# Patient Record
Sex: Female | Born: 1985 | Race: White | Hispanic: No | Smoking: Current every day smoker
Health system: Southern US, Community
[De-identification: ages and names within clinical notes are randomized; demographics above are authoritative.]

## PROBLEM LIST (undated history)

## (undated) DIAGNOSIS — R112 Nausea with vomiting, unspecified: Secondary | ICD-10-CM

## (undated) DIAGNOSIS — Z9889 Other specified postprocedural states: Secondary | ICD-10-CM

## (undated) DIAGNOSIS — F99 Mental disorder, not otherwise specified: Secondary | ICD-10-CM

## (undated) DIAGNOSIS — M797 Fibromyalgia: Secondary | ICD-10-CM

## (undated) DIAGNOSIS — R87629 Unspecified abnormal cytological findings in specimens from vagina: Secondary | ICD-10-CM

## (undated) DIAGNOSIS — T1490XA Injury, unspecified, initial encounter: Secondary | ICD-10-CM

## (undated) DIAGNOSIS — B079 Viral wart, unspecified: Secondary | ICD-10-CM

## (undated) DIAGNOSIS — Q828 Other specified congenital malformations of skin: Secondary | ICD-10-CM

## (undated) HISTORY — DX: Other specified congenital malformations of skin: Q82.8

## (undated) HISTORY — DX: Injury, unspecified, initial encounter: T14.90XA

## (undated) HISTORY — DX: Unspecified abnormal cytological findings in specimens from vagina: R87.629

## (undated) HISTORY — PX: ENDOMETRIAL ABLATION: SHX621

## (undated) HISTORY — DX: Viral wart, unspecified: B07.9

## (undated) HISTORY — PX: TUBAL LIGATION: SHX77

## (undated) HISTORY — PX: TONSILLECTOMY: SUR1361

## (undated) HISTORY — DX: Mental disorder, not otherwise specified: F99

---
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
## 2005-05-19 ENCOUNTER — Inpatient Hospital Stay (HOSPITAL_COMMUNITY): Admission: AD | Admit: 2005-05-19 | Discharge: 2005-05-19 | Payer: Self-pay | Admitting: Obstetrics and Gynecology

## 2005-06-05 ENCOUNTER — Inpatient Hospital Stay (HOSPITAL_COMMUNITY): Admission: AD | Admit: 2005-06-05 | Discharge: 2005-06-05 | Payer: Self-pay | Admitting: Obstetrics

## 2005-06-13 ENCOUNTER — Encounter (INDEPENDENT_AMBULATORY_CARE_PROVIDER_SITE_OTHER): Payer: Self-pay | Admitting: Specialist

## 2005-06-13 ENCOUNTER — Inpatient Hospital Stay (HOSPITAL_COMMUNITY): Admission: AD | Admit: 2005-06-13 | Discharge: 2005-06-15 | Payer: Self-pay | Admitting: Obstetrics and Gynecology

## 2005-07-26 ENCOUNTER — Other Ambulatory Visit: Admission: RE | Admit: 2005-07-26 | Discharge: 2005-07-26 | Payer: Self-pay | Admitting: Obstetrics and Gynecology

## 2005-08-09 ENCOUNTER — Ambulatory Visit: Payer: Self-pay | Admitting: Gastroenterology

## 2005-08-24 ENCOUNTER — Ambulatory Visit: Payer: Self-pay | Admitting: Gastroenterology

## 2005-08-31 ENCOUNTER — Encounter: Payer: Self-pay | Admitting: Gastroenterology

## 2005-08-31 ENCOUNTER — Ambulatory Visit: Payer: Self-pay | Admitting: Gastroenterology

## 2005-09-05 ENCOUNTER — Ambulatory Visit: Payer: Self-pay | Admitting: Gastroenterology

## 2006-09-18 ENCOUNTER — Inpatient Hospital Stay (HOSPITAL_COMMUNITY): Admission: AD | Admit: 2006-09-18 | Discharge: 2006-09-18 | Payer: Self-pay | Admitting: Obstetrics and Gynecology

## 2006-10-22 ENCOUNTER — Inpatient Hospital Stay (HOSPITAL_COMMUNITY): Admission: AD | Admit: 2006-10-22 | Discharge: 2006-10-22 | Payer: Self-pay | Admitting: Obstetrics and Gynecology

## 2006-11-22 ENCOUNTER — Inpatient Hospital Stay (HOSPITAL_COMMUNITY): Admission: RE | Admit: 2006-11-22 | Discharge: 2006-11-23 | Payer: Self-pay | Admitting: Obstetrics and Gynecology

## 2007-09-22 ENCOUNTER — Ambulatory Visit (HOSPITAL_BASED_OUTPATIENT_CLINIC_OR_DEPARTMENT_OTHER): Admission: RE | Admit: 2007-09-22 | Discharge: 2007-09-22 | Payer: Self-pay | Admitting: Rheumatology

## 2007-10-13 ENCOUNTER — Ambulatory Visit: Payer: Self-pay | Admitting: Internal Medicine

## 2007-10-23 ENCOUNTER — Encounter: Admission: RE | Admit: 2007-10-23 | Discharge: 2007-10-23 | Payer: Self-pay | Admitting: Rheumatology

## 2009-09-04 ENCOUNTER — Emergency Department (HOSPITAL_BASED_OUTPATIENT_CLINIC_OR_DEPARTMENT_OTHER)
Admission: EM | Admit: 2009-09-04 | Discharge: 2009-09-05 | Payer: Self-pay | Source: Home / Self Care | Admitting: Emergency Medicine

## 2009-12-28 ENCOUNTER — Emergency Department (HOSPITAL_BASED_OUTPATIENT_CLINIC_OR_DEPARTMENT_OTHER)
Admission: EM | Admit: 2009-12-28 | Discharge: 2009-12-28 | Payer: Self-pay | Source: Home / Self Care | Admitting: Emergency Medicine

## 2010-01-21 ENCOUNTER — Ambulatory Visit (HOSPITAL_COMMUNITY)
Admission: RE | Admit: 2010-01-21 | Discharge: 2010-01-21 | Payer: Self-pay | Source: Home / Self Care | Attending: Obstetrics & Gynecology | Admitting: Obstetrics & Gynecology

## 2010-03-08 ENCOUNTER — Encounter: Payer: Self-pay | Admitting: Rheumatology

## 2010-04-26 LAB — PREGNANCY, URINE: Preg Test, Ur: NEGATIVE

## 2010-05-01 LAB — URINALYSIS, ROUTINE W REFLEX MICROSCOPIC
Bilirubin Urine: NEGATIVE
Glucose, UA: NEGATIVE mg/dL
Hgb urine dipstick: NEGATIVE
Ketones, ur: 15 mg/dL — AB
Nitrite: NEGATIVE
Protein, ur: NEGATIVE mg/dL
Specific Gravity, Urine: 1.016 (ref 1.005–1.030)
Urobilinogen, UA: 0.2 mg/dL (ref 0.0–1.0)
pH: 7 (ref 5.0–8.0)

## 2010-05-01 LAB — URINE MICROSCOPIC-ADD ON

## 2010-05-01 LAB — URINE CULTURE: Colony Count: 45000

## 2010-05-01 LAB — PREGNANCY, URINE: Preg Test, Ur: NEGATIVE

## 2010-06-29 NOTE — Consult Note (Signed)
[name] [name], SHANKER NO.:  [id number]   MEDICAL RECORD NO.:  [id number]          PATIENT TYPE:  MAT   LOCATION:  [facility]C                          FACILITY:  [facility]   PHYSICIAN:  Lenoard Aden, M.D.DATE OF BIRTH:  21-Aug-1985   DATE OF CONSULTATION:  DATE OF DISCHARGE:                                 CONSULTATION   CHIEF COMPLAINT:  Headache and shortness of breath.   She is a 25 year old white female, G2, P1, at 59 weeks' gestation, who  presents with onset this afternoon of shortness of breath and unilateral  headache which initially was severe with sharp mid sternal pain, now  with marked resolution and has become a dull pain with slight resolution  of her headache.  She did not take her migraine medication at that time.  She denies at the time any chest pain, shortness of breath.  She denies  any productive cough.   MEDICAL HISTORY:  1. Migraine headaches.  2. Abnormal Pap smear.   She is a nonsmoker, nondrinker.  She denies domestic or physical  violence.   SURGICAL HISTORY:  1. Tonsillectomy.  2. Wisdom tooth extraction.   FAMILY HISTORY:  Remarkable for hypercholesterolemia, anemia, breast  cancer, colon cancer, schizophrenia, alcohol abuse, and congenital  anomalies.   MEDICATIONS:  Prenatal vitamins, Zantac, and Fioricet as needed.   PHYSICAL EXAMINATION:  GENERAL:  She is well-developed, well-nourished,  white female in no acute distress.  VITAL SIGNS:  Blood pressure 112/60, oxygen saturation 95, pulse of 88,  respirations 18.  HEENT:  Normal.  LUNGS:  Clear.  HEART:  Regular rhythm.  ABDOMEN:  Soft, gravid nontender.  No CVA tenderness is noted.  EXTREMITIES:  Reveal no cords.  NEUROLOGIC:  Nonfocal.  SKIN:  Intact.  PELVIC:  Deferred.   NST is reactive.  No contractions noted.   IMPRESSION:  1. Patient with a 29-week intrauterine pregnancy.  2. Previous history of headache, shortness of breath.  No evidence of      pre-eclampsia,  probable migraine prodrome now having taken two      Fioricet.  3. Chest pain, questionable atypical, questionable reflux, now having      resolved.  Normal cardiovascular exam.   PLAN:  Discharge home, Fioricet 2 p.o. now, follow up as needed, chest x-  ray is no improvement.      Lenoard Aden, M.D.  Electronically Signed     RJT/MEDQ  D:  09/18/2006  T:  09/18/2006  Job:  [id number]

## 2010-06-29 NOTE — Procedures (Signed)
NAME:  [name] [name], RUEHL NO.:  [id number]   MEDICAL RECORD NO.:  [id number]          PATIENT TYPE:  OUT   LOCATION:  SLEEP CENTER                 FACILITY:  [facility]   PHYSICIAN:  Clinton D. Maple Hudson, MD, FCCP, FACPDATE OF BIRTH:  Jan 23, 1986   DATE OF STUDY:                            NOCTURNAL POLYSOMNOGRAM   REFERRING PHYSICIAN:  Kathryne Hitch, MD   INDICATION FOR STUDY:  Insomnia with sleep apnea.   EPWORTH SLEEPINESS SCORE:  9/24, BMI 46, weight 285 pounds, height 66  inches, neck 15 inches.   HOME MEDICATION:  Charted and reviewed.   SLEEP ARCHITECTURE:  Total sleep time 317.5 minutes with sleep  efficiency 79.4%.  Stage I was 3.1%, stage II 56.2%, stage III 17.8%,  REM 22.8% of total sleep time.  Sleep latency 60 minutes, REM latency  135 minutes, awake after sleep onset 7 minutes, arousal index 11.5.  No  bedtime medication taken.   RESPIRATORY DATA:  Apnea/hypopnea index (AHI) 0.  No respiratory events  were scored.   OXYGEN DATA:  Moderate snoring with oxygen desaturation to a nadir of  91%.  Mean oxygen saturation through the study was 95.6% on room air.   CARDIAC DATA:  Normal sinus rhythm.   MOVEMENT/PARASOMNIA:  No significant movement disturbance.  No bathroom  trips.   IMPRESSIONS/RECOMMENDATIONS:  1. Unremarkable sleep architecture for sleep center environment.  She      complained of fibromyalgia pain which she attributed to moving      heavy tanning beds earlier.  2. No significant respiratory disturbance, AHI (apnea/hypopnea index)      0 per hour.  Moderate snoring with oxygen desaturation to a nadir      of 91%.  3. No significant movement disturbance of sleep.  4. Patient woke at 3:30 a.m. indicating that she would not be able to      return to sleep and went home at that time.     Clinton D. Maple Hudson, MD, [facility], FACP  Diplomate, Biomedical engineer of Sleep Medicine  Electronically Signed    CDY/MEDQ  D:  10/13/2007 10:56:29  T:   10/13/2007 11:51:38  Job:  [id number]

## 2010-07-02 NOTE — Op Note (Signed)
NAMEMANDI, MATTIOLI              ACCOUNT NO.:  [id number]   MEDICAL RECORD NO.:  [id number]          PATIENT TYPE:  AMB   LOCATION:  DSC                          FACILITY:  [facility]   PHYSICIAN:  Jefry H. Pollyann Kennedy, MD     DATE OF BIRTH:  10-31-1985   DATE OF PROCEDURE:  02/23/2004  DATE OF DISCHARGE:                                 OPERATIVE REPORT   PREOPERATIVE DIAGNOSIS:  Chronic tonsillitis.   POSTOPERATIVE DIAGNOSIS:  Chronic tonsillitis.   PROCEDURE:  Tonsillectomy.   SURGEON:  Jefry H. Pollyann Kennedy, M.D.   ANESTHESIA:  General endotracheal anesthesia was used.   COMPLICATIONS:  No complications.   BLOOD LOSS:  Minimal.   REFERRING PHYSICIAN:  [facility].   HISTORY:  This is an 25 year old with a history of chronic and recurring  tonsillar pharyngitis.  Risks, benefits, alternatives, and complications of  the procedure were explained to the patient.  She seemed to understand and  agreed to the surgery.   PROCEDURE:  The patient was taken to the operating room and placed on the  operating table in the supine position.  Following the induction of general  endotracheal anesthesia, the patient was prepped and draped in a standard  fashion.  A Crowe-Davis mouth gag was inserted into the oral cavity and used  to retract the tongue and mandible and attached to the Mayo stand.  Inspection of the palate revealed no evidence of submucous cleft or  shortening of the soft palate.  A red rubber catheter was inserted into the  right side of the nose and withdrawn through the mouth and used to retract  the soft palate and uvula.  Indirect examination of the pharynx was  performed, and minimal adenoid tissue was present.  Tonsillectomy was  performed using electrocautery dissection, carefully dissecting the  avascular plane between the capsule and constrictor muscles.  Spot cautery  was used as an aid for completion of hemostasis.  The tonsils were very  large, pitted, and with  some fibrosis at the bases and at their  attachment to the constrictors.  Tonsils were sent together for pathologic  evaluation.  Pharynx was suctioned and blood and secretions irrigated with  saline, and an orogastric tube was used to aspirate the contents of the  stomach.  The patient was then awakened, extubated, and transferred to  recovery in stable condition.      Jefr   JHR/MEDQ  D:  02/23/2004  T:  02/23/2004  Job:  [id number]   cc:   [facility]

## 2010-07-02 NOTE — Discharge Summary (Signed)
NAME:  [name] [name], [name] [name]              ACCOUNT NO.:  [id number]   MEDICAL RECORD NO.:  [id number]          PATIENT TYPE:  INP   LOCATION:  9126                          FACILITY:  [facility]   PHYSICIAN:  James A. Ashley Royalty, M.D.DATE OF BIRTH:  November 09, 1985   DATE OF ADMISSION:  06/13/2005  DATE OF DISCHARGE:  06/15/2005                                 DISCHARGE SUMMARY   DISCHARGE DIAGNOSES:  1.  Intrauterine pregnancy at 41-1/2 weeks' gestation.  2.  A negative blood type.  3.  Migraine headaches.  4.  Term birth living child vertex.   OPERATIONS/SPECIAL PROCEDURES:  OB delivery with repair of right-sided  vaginal sidewall laceration.   CONSULTATIONS:  None.   DISCHARGE MEDICATIONS:  1.  Motrin 600 mg.  2.  Percocet.   HISTORY AND PHYSICAL:  This is a 25 year old primigravida at 41-1/[redacted] weeks  gestation with the aforementioned risk factors.  The patient presented  complaining of contractions.  Initial cervical examination revealed the  cervix to be 2-3 cm dilated, 75% effaced, -2 station, vertex presentation.   HOSPITAL COURSE:  The patient admitted to [facility].  Artificial rupture of membranes was accomplished which revealed thin  meconium.  The patient went on to labor and deliver an 8 pound 15 ounce  female, Apgars 8 at one minute, 9 at five minutes, sent to the newborn  nursery.  Delivery was accomplished by Erven Colla.  The infant was  DeLee'd on the perineum.  The pediatric team was present.  There was no  episiotomy, but there was a right-sided vaginal sidewall laceration which  was repaired by me without difficulty.  The patient's postpartum course was  benign.  She was discharged on the second postpartum day, afebrile and in  satisfactory condition.   DISPOSITION:  The patient is to return to [facility] in 4-6 weeks  for postpartum evaluation.      James A. Ashley Royalty, M.D.  Electronically Signed     JAM/MEDQ  D:  08/03/2005  T:   08/04/2005  Job:  [id number]

## 2010-07-02 NOTE — Procedures (Signed)
[facility]                                 ULTRASOUND STUDY   NAME:[name], [name]                       MRN:          [id number]  DATE:  09/06/2005                              DOB:    PROCEDURE:  Multiplanar abdominal ultrasound imaging was performed in the  upright, supine, right and left lateral decubitus positions.   RESULTS:  Abdominal aorta 1.4 X 1.3 cm.  The IVC is patent.   The pancreas appears normal throughout the head, body and tail without  evidence of ductal dilatation, pancreatic masses, or peripancreatic  inflammation.   Gallbladder is well distended, thin walled, with no pericholecystic fluid or  intraluminal echogenic foci to suggest gallstone disease.   The common bile duct measures 3 mm in maximal diameter without evidence of  intraluminal foci.   The liver appears normal without evidence of parenchymal lesion, ductal  dilatation or vascular abnormality.   Kidneys are normal in appearance.  Right 11.4 cm.  Left 11.9 cm.   Spleen is normal in size, measuring 14 x 6.4 cm without parenchymal lesion.   ASSESSMENT:  The patient had an excellent ultrasound exam that appears  normal without evidence of cholelithiasis.  The pancreas and liver are well-  visualized, appeared normal.                                   Vania Rea. Jarold Motto, MD, [facility], [place]   DRP/MedQ  DD:  09/06/2005  DT:  09/06/2005  Job #:  [id number]

## 2010-11-25 LAB — CBC
HCT: 27.4 — ABNORMAL LOW
HCT: 30.5 — ABNORMAL LOW
Hemoglobin: 10.7 — ABNORMAL LOW
Hemoglobin: 9.5 — ABNORMAL LOW
MCHC: 34.8
MCHC: 35.1
MCV: 83.9
MCV: 84.1
Platelets: 177
Platelets: 204
RBC: 3.26 — ABNORMAL LOW
RBC: 3.64 — ABNORMAL LOW
RDW: 14.5 — ABNORMAL HIGH
RDW: 14.5 — ABNORMAL HIGH
WBC: 11.8 — ABNORMAL HIGH
WBC: 9.1

## 2010-11-25 LAB — RPR: RPR Ser Ql: NONREACTIVE

## 2010-11-26 LAB — URINALYSIS, ROUTINE W REFLEX MICROSCOPIC
Bilirubin Urine: NEGATIVE
Glucose, UA: NEGATIVE
Hgb urine dipstick: NEGATIVE
Ketones, ur: NEGATIVE
Nitrite: NEGATIVE
Protein, ur: NEGATIVE
Specific Gravity, Urine: 1.01
Urobilinogen, UA: 0.2
pH: 6.5

## 2011-04-27 ENCOUNTER — Other Ambulatory Visit (HOSPITAL_COMMUNITY)
Admission: RE | Admit: 2011-04-27 | Discharge: 2011-04-27 | Disposition: A | Discharge status: Home / Self Care | Payer: Commercial Indemnity | Source: Ambulatory Visit | Attending: Obstetrics and Gynecology | Admitting: Obstetrics and Gynecology

## 2011-04-27 ENCOUNTER — Other Ambulatory Visit: Payer: Self-pay | Admitting: Adult Health

## 2011-04-27 DIAGNOSIS — Z113 Encounter for screening for infections with a predominantly sexual mode of transmission: Secondary | ICD-10-CM | POA: Insufficient documentation

## 2011-04-27 DIAGNOSIS — Z01419 Encounter for gynecological examination (general) (routine) without abnormal findings: Secondary | ICD-10-CM | POA: Insufficient documentation

## 2011-05-16 ENCOUNTER — Other Ambulatory Visit: Payer: Self-pay | Admitting: Obstetrics & Gynecology

## 2011-05-17 ENCOUNTER — Other Ambulatory Visit (HOSPITAL_COMMUNITY): Payer: Commercial Indemnity

## 2011-05-17 ENCOUNTER — Encounter (HOSPITAL_COMMUNITY): Payer: Self-pay | Admitting: Pharmacy Technician

## 2011-05-17 ENCOUNTER — Encounter (HOSPITAL_COMMUNITY): Payer: Self-pay

## 2011-05-17 ENCOUNTER — Encounter (HOSPITAL_COMMUNITY)
Admission: RE | Admit: 2011-05-17 | Discharge: 2011-05-17 | Disposition: A | Discharge status: Home / Self Care | Payer: Managed Care, Other (non HMO) | Source: Ambulatory Visit | Attending: Obstetrics & Gynecology | Admitting: Obstetrics & Gynecology

## 2011-05-17 HISTORY — DX: Fibromyalgia: M79.7

## 2011-05-17 HISTORY — DX: Nausea with vomiting, unspecified: R11.2

## 2011-05-17 HISTORY — DX: Other specified postprocedural states: Z98.890

## 2011-05-17 LAB — SURGICAL PCR SCREEN
MRSA, PCR: NEGATIVE
Staphylococcus aureus: NEGATIVE

## 2011-05-17 NOTE — OR Nursing (Signed)
Labs on chart from [facility], cbc, cmp

## 2011-05-17 NOTE — Patient Instructions (Signed)
Endometrial Ablation Endometrial ablation removes the lining of the uterus (endometrium). It is usually a same day, outpatient treatment. Ablation helps avoid major surgery (such as a hysterectomy). A hysterectomy is removal of the cervix and uterus. Endometrial ablation has less risk and complications, has a shorter recovery period and is less expensive. After endometrial ablation, most women will have little or no menstrual bleeding. You may not keep your fertility. Pregnancy is no longer likely after this procedure but if you are pre-menopausal, you still need to use a reliable method of birth control following the procedure because pregnancy can occur. REASONS TO HAVE THE PROCEDURE MAY INCLUDE:  Heavy periods.   Bleeding that is causing anemia.   Anovulatory bleeding, very irregular, bleeding.   Bleeding submucous fibroids (on the lining inside the uterus) if they are smaller than 3 centimeters.  REASONS NOT TO HAVE THE PROCEDURE MAY INCLUDE:  You wish to have more children.   You have a pre-cancerous or cancerous problem. The cause of any abnormal bleeding must be diagnosed before having the procedure.   You have pain coming from the uterus.   You have a submucus fibroid larger than 3 centimeters.   You recently had a baby.   You recently had an infection in the uterus.   You have a severe retro-flexed, tipped uterus and cannot insert the instrument to do the ablation.   You had a Cesarean section or deep major surgery on the uterus.   The inner cavity of the uterus is too large for the endometrial ablation instrument.  RISKS AND COMPLICATIONS   Perforation of the uterus.   Bleeding.   Infection of the uterus, bladder or vagina.   Injury to surrounding organs.   Cutting the cervix.   An air bubble to the lung (air embolus).   Pregnancy following the procedure.   Failure of the procedure to help the problem requiring hysterectomy.   Decreased ability to diagnose  cancer in the lining of the uterus.  BEFORE THE PROCEDURE  The lining of the uterus must be tested to make sure there is no pre-cancerous or cancer cells present.   Medications may be given to make the lining of the uterus thinner.   Ultrasound may be used to evaluate the size and look for abnormalities of the uterus.   Future pregnancy is not desired.  PROCEDURE  There are different ways to destroy the lining of the uterus.   Resectoscope - radio frequency-alternating electric current is the most common one used.   Cryotherapy - freezing the lining of the uterus.   Heated Free Liquid - heated salt (saline) solution inserted into the uterus.   Microwave - uses high energy microwaves in the uterus.   Thermal Balloon - a catheter with a balloon tip is inserted into the uterus and filled with heated fluid.  Your caregiver will talk with you about the method used in this clinic. They will also instruct you on the pros and cons of the procedure. Endometrial ablation is performed along with a procedure called operative hysteroscopy. A narrow viewing tube is inserted through the birth canal (vagina) and through the cervix into the uterus. A tiny camera attached to the viewing tube (hysteroscope) allows the uterine cavity to be shown on a TV monitor20 ROLA LENNON  05/17/2011   Your procedure is scheduled on:  4-04/2011  Report to [facility] at  615  AM.  Call this number if you have problems the morning of surgery:  [phone]   Remember:   Do not eat food:After Midnight.  May have clear liquids:until Midnight .  Clear liquids include soda, tea, black coffee, apple or grape juice, broth.  Take these medicines the morning of surgery with A SIP OF WATER: none   Do not wear jewelry, make-up or nail polish.  Do not wear lotions, powders, or perfumes. You may wear deodorant.  Do not shave 48 hours prior to surgery.  Do not bring valuables to the hospital.  Contacts, dentures or bridgework  may not be worn into surgery.  Leave suitcase in the car. After surgery it may be brought to your room.  For patients admitted to the hospital, checkout time is 11:00 AM the day of discharge.   Patients discharged the day of surgery will not be allowed to drive home.  Name and phone number of your driver: family  Special Instructions: CHG Shower Use Special Wash: 1/2 bottle night before surgery and 1/2 bottle morning of surgery.   Please read over the following fact sheets that you were given: Pain Booklet, MRSA Information, Surgical Site Infection Prevention, Anesthesia Post-op Instructions and Care and Recovery After Surgery  during surgery. Your uterus is filled with a harmless liquid to make the procedure easier. The lining of the uterus is then removed. The lining can also be removed with a resectoscope which allows your surgeon to cut away the lining of the uterus under direct vision. Usually, you will be able to go home within an hour after the procedure. HOME CARE INSTRUCTIONS   Do not drive for 24 hours.   No tampons, douching or intercourse for 2 weeks or until your caregiver approves.   Rest at home for 24 to 48 hours. You may then resume normal activities unless told differently by your caregiver.   Take your temperature two times a day for 4 days, and record it.   Take any medications your caregiver has ordered, as directed.   Use some form of contraception if you are pre-menopausal and do not want to get pregnant.  Bleeding after the procedure is normal. It varies from light spotting and mildly watery to bloody discharge for 4 to 6 weeks. You may also have mild cramping. Only take over-the-counter or prescription medicines for pain, discomfort, or fever as directed by your caregiver. Do not use aspirin, as this may aggravate bleeding. Frequent urination during the first 24 hours is normal. You will not know how effective your surgery is until at least 3 months after the  surgery. SEEK IMMEDIATE MEDICAL CARE IF:   Bleeding is heavier than a normal menstrual cycle.   An oral temperature above 102 F (38.9 C) develops.   You have increasing cramps or pains not relieved with medication or develop belly (abdominal) pain which does not seem to be related to the same area of earlier cramping and pain.   You are light headed, weak or have fainting episodes.   You develop pain in the shoulder strap areas.   You have chest or leg pain.   You have abnormal vaginal discharge.   You have painful urination.  Document Released: 12/11/2003 Document Revised: 01/20/2011 Document Reviewed: 03/10/2007 [facility] Patient Information 2012 [place], [place].Hysteroscopy Hysteroscopy is a procedure used for looking inside the womb (uterus). It may be done for many different reasons, including:  To evaluate abnormal bleeding, fibroid (benign, noncancerous) tumors, polyps, scar tissue (adhesions), and possibly cancer of the uterus.   To look for lumps (tumors) and other  uterine growths.   To look for causes of why a woman cannot get pregnant (infertility), causes of recurrent loss of pregnancy (miscarriages), or a lost intrauterine device (IUD).   To perform a sterilization by blocking the fallopian tubes from inside the uterus.  A hysteroscopy should be done right after a menstrual period to be sure you are not pregnant. LET YOUR CAREGIVER KNOW ABOUT:   Allergies.   Medicines taken, including herbs, eyedrops, over-the-counter medicines, and creams.   Use of steroids (by mouth or creams).   Previous problems with anesthetics or numbing medicines.   History of bleeding or blood problems.   History of blood clots.   Possibility of pregnancy, if this applies.   Previous surgery.   Other health problems.  RISKS AND COMPLICATIONS   Putting a hole in the uterus.   Excessive bleeding.   Infection.   Damage to the cervix.   Injury to other organs.   Allergic  reaction to medicines.   Too much fluid used in the uterus for the procedure.  BEFORE THE PROCEDURE   Do not take aspirin or blood thinners for a week before the procedure, or as directed. It can cause bleeding.   Arrive at least 60 minutes before the procedure or as directed to read and sign the necessary forms.   Arrange for someone to take you home after the procedure.   If you smoke, do not smoke for 2 weeks before the procedure.  PROCEDURE   Your caregiver may give you medicine to relax you. He or she may also give you a medicine that numbs the area around the cervix (local anesthetic) or a medicine that makes you sleep (general anesthesia).   Sometimes, a medicine is placed in the cervix the day before the procedure. This medicine makes the cervix have a larger opening (dilate). This makes it easier for the instrument to be inserted into the uterus.   A small instrument (hysteroscope) is inserted through the vagina into the uterus. This instrument is similar to a pencil-sized telescope with a light.   During the procedure, air or a liquid is put into the uterus, which allows the surgeon to see better.   Sometimes, tissue is gently scraped from inside the uterus. These tissue samples are sent to a specialist who looks at tissue samples (pathologist). The pathologist will give a report to your caregiver. This will help your caregiver decide if further treatment is necessary. The report will also help your caregiver decide on the best treatment if the test comes back abnormal.  AFTER THE PROCEDURE   If you had a general anesthetic, you may be groggy for a couple hours after the procedure.   If you had a local anesthetic, you will be advised to rest at the surgical center or caregiver's office until you are stable and feel ready to go home.   You may have some cramping for a couple days.   You may have bleeding, which varies from light spotting for a few days to menstrual-like  bleeding for up to 3 to 7 days. This is normal.   Have someone take you home.  FINDING OUT THE RESULTS OF YOUR TEST Not all test results are available during your visit. If your test results are not back during the visit, make an appointment with your caregiver to find out the results. Do not assume everything is normal if you have not heard from your caregiver or the medical facility. It is important for  you to follow up on all of your test results. HOME CARE INSTRUCTIONS   Do not drive for 24 hours or as instructed.   Only take over-the-counter or prescription medicines for pain, discomfort, or fever as directed by your caregiver.   Do not take aspirin. It can cause or aggravate bleeding.   Do not drive or drink alcohol while taking pain medicine.   You may resume your usual diet.   Do not use tampons, douche, or have sexual intercourse for 2 weeks, or as advised by your caregiver.   Rest and sleep for the first 24 to 48 hours.   Take your temperature twice a day for 4 to 5 days. Write it down. Give these temperatures to your caregiver if they are abnormal (above 98.6 F or 37.0 C).   Take medicines your caregiver has ordered as directed.   Follow your caregiver's advice regarding diet, exercise, lifting, driving, and general activities.   Take showers instead of baths for 2 weeks, or as recommended by your caregiver.   If you develop constipation:   Take a mild laxative with the advice of your caregiver.   Eat bran foods.   Drink enough water and fluids to keep your urine clear or pale yellow.   Try to have someone with you or available to you for the first 24 to 48 hours, especially if you had a general anesthetic.   Make sure you and your family understand everything about your operation and recovery.   Follow your caregiver's advice regarding follow-up appointments and Pap smears.  SEEK MEDICAL CARE IF:   You feel dizzy or lightheaded.   You feel sick to your  stomach (nauseous).   You develop abnormal vaginal discharge.   You develop a rash.   You have an abnormal reaction or allergy to your medicine.   You need stronger pain medicine.  SEEK IMMEDIATE MEDICAL CARE IF:   Bleeding is heavier than a normal menstrual period or you have blood clots.   You have an oral temperature above 102 F (38.9 C), not controlled by medicine.   You have increasing cramps or pains not relieved with medicine.   You develop belly (abdominal) pain that does not seem to be related to the same area of earlier cramping and pain.   You pass out.   You develop pain in the tops of your shoulders (shoulder strap areas).   You develop shortness of breath.  MAKE SURE YOU:   Understand these instructions.   Will watch your condition.   Will get help right away if you are not doing well or get worse.  Document Released: 05/09/2000 Document Revised: 01/20/2011 Document Reviewed: 09/01/2008 [facility] Patient Information 2012 [place], [place].PATIENT INSTRUCTIONS POST-ANESTHESIA  IMMEDIATELY FOLLOWING SURGERY:  Do not drive or operate machinery for the first twenty four hours after surgery.  Do not make any important decisions for twenty four hours after surgery or while taking narcotic pain medications or sedatives.  If you develop intractable nausea and vomiting or a severe headache please notify your doctor immediately.  FOLLOW-UP:  Please make an appointment with your surgeon as instructed. You do not need to follow up with anesthesia unless specifically instructed to do so.  WOUND CARE INSTRUCTIONS (if applicable):  Keep a dry clean dressing on the anesthesia/puncture wound site if there is drainage.  Once the wound has quit draining you may leave it open to air.  Generally you should leave the bandage intact for twenty four  hours unless there is drainage.  If the epidural site drains for more than 36-48 hours please call the anesthesia department.  QUESTIONS?:   Please feel free to call your physician or the hospital operator if you have any questions, and they will be happy to assist you.     [facility] Anesthesia Department [address] [place] [phone]

## 2011-05-18 ENCOUNTER — Encounter (HOSPITAL_COMMUNITY): Payer: Self-pay | Admitting: *Deleted

## 2011-05-18 ENCOUNTER — Ambulatory Visit (HOSPITAL_COMMUNITY): Payer: Managed Care, Other (non HMO) | Admitting: Anesthesiology

## 2011-05-18 ENCOUNTER — Encounter (HOSPITAL_COMMUNITY): Payer: Self-pay | Admitting: Anesthesiology

## 2011-05-18 ENCOUNTER — Other Ambulatory Visit: Payer: Self-pay | Admitting: Obstetrics & Gynecology

## 2011-05-18 ENCOUNTER — Ambulatory Visit (HOSPITAL_COMMUNITY)
Admission: RE | Admit: 2011-05-18 | Discharge: 2011-05-18 | Disposition: A | Discharge status: Home / Self Care | Payer: Managed Care, Other (non HMO) | Source: Ambulatory Visit | Attending: Obstetrics & Gynecology | Admitting: Obstetrics & Gynecology

## 2011-05-18 ENCOUNTER — Encounter (HOSPITAL_COMMUNITY)
Admission: RE | Disposition: A | Discharge status: Home / Self Care | Payer: Self-pay | Source: Ambulatory Visit | Attending: Obstetrics & Gynecology

## 2011-05-18 DIAGNOSIS — F1210 Cannabis abuse, uncomplicated: Secondary | ICD-10-CM | POA: Insufficient documentation

## 2011-05-18 DIAGNOSIS — N920 Excessive and frequent menstruation with regular cycle: Secondary | ICD-10-CM | POA: Insufficient documentation

## 2011-05-18 DIAGNOSIS — Z9889 Other specified postprocedural states: Secondary | ICD-10-CM

## 2011-05-18 DIAGNOSIS — IMO0001 Reserved for inherently not codable concepts without codable children: Secondary | ICD-10-CM | POA: Insufficient documentation

## 2011-05-18 DIAGNOSIS — Z01812 Encounter for preprocedural laboratory examination: Secondary | ICD-10-CM | POA: Insufficient documentation

## 2011-05-18 DIAGNOSIS — F17200 Nicotine dependence, unspecified, uncomplicated: Secondary | ICD-10-CM | POA: Insufficient documentation

## 2011-05-18 LAB — PREGNANCY, URINE: Preg Test, Ur: NEGATIVE

## 2011-05-18 SURGERY — DILATATION & CURETTAGE/HYSTEROSCOPY WITH THERMACHOICE ABLATION
Anesthesia: General | Site: Uterus | Wound class: Clean Contaminated

## 2011-05-18 MED ORDER — CEFAZOLIN SODIUM 1-5 GM-% IV SOLN: 1.0000 g | INTRAVENOUS | Status: DC

## 2011-05-18 MED ORDER — KETOROLAC TROMETHAMINE 10 MG PO TABS: 10.0000 mg | ORAL_TABLET | Freq: Three times a day (TID) | ORAL | Status: AC | PRN

## 2011-05-18 MED ORDER — ONDANSETRON HCL 4 MG/2ML IJ SOLN
4.0000 mg | Freq: Once | INTRAMUSCULAR | Status: AC | PRN
  Administered 2011-05-18: 4 mg via INTRAVENOUS

## 2011-05-18 MED ORDER — ONDANSETRON HCL 4 MG/2ML IJ SOLN
INTRAMUSCULAR | Status: AC
  Filled 2011-05-18: qty 2

## 2011-05-18 MED ORDER — FENTANYL CITRATE 0.05 MG/ML IJ SOLN
INTRAMUSCULAR | Status: DC | PRN
  Administered 2011-05-18: 25 ug via INTRAVENOUS
  Administered 2011-05-18: 50 ug via INTRAVENOUS
  Administered 2011-05-18: 25 ug via INTRAVENOUS

## 2011-05-18 MED ORDER — FENTANYL CITRATE 0.05 MG/ML IJ SOLN
INTRAMUSCULAR | Status: AC
  Administered 2011-05-18: 50 ug via INTRAVENOUS
  Filled 2011-05-18: qty 2

## 2011-05-18 MED ORDER — ONDANSETRON HCL 4 MG/2ML IJ SOLN
INTRAMUSCULAR | Status: AC
  Administered 2011-05-18: 4 mg via INTRAVENOUS
  Filled 2011-05-18: qty 2

## 2011-05-18 MED ORDER — MIDAZOLAM HCL 2 MG/2ML IJ SOLN
1.0000 mg | INTRAMUSCULAR | Status: DC | PRN
  Administered 2011-05-18: 2 mg via INTRAVENOUS

## 2011-05-18 MED ORDER — LACTATED RINGERS IV SOLN
INTRAVENOUS | Status: DC
Start: 2011-05-18 — End: 2011-05-18

## 2011-05-18 MED ORDER — MIDAZOLAM HCL 2 MG/2ML IJ SOLN
INTRAMUSCULAR | Status: AC
  Filled 2011-05-18: qty 2

## 2011-05-18 MED ORDER — LIDOCAINE HCL 1 % IJ SOLN
INTRAMUSCULAR | Status: DC | PRN
  Administered 2011-05-18: 40 mg via INTRADERMAL

## 2011-05-18 MED ORDER — ONDANSETRON HCL 4 MG/2ML IJ SOLN
4.0000 mg | Freq: Once | INTRAMUSCULAR | Status: AC
  Administered 2011-05-18: 4 mg via INTRAVENOUS

## 2011-05-18 MED ORDER — CEFAZOLIN SODIUM 1-5 GM-% IV SOLN
INTRAVENOUS | Status: DC | PRN
  Administered 2011-05-18: 1 g via INTRAVENOUS

## 2011-05-18 MED ORDER — LACTATED RINGERS IV SOLN
INTRAVENOUS | Status: DC
  Administered 2011-05-18: 1000 mL via INTRAVENOUS

## 2011-05-18 MED ORDER — KETOROLAC TROMETHAMINE 30 MG/ML IJ SOLN
30.0000 mg | Freq: Once | INTRAMUSCULAR | Status: AC
  Administered 2011-05-18: 30 mg via INTRAVENOUS

## 2011-05-18 MED ORDER — PROPOFOL 10 MG/ML IV BOLUS
INTRAVENOUS | Status: DC | PRN
  Administered 2011-05-18: 150 mg via INTRAVENOUS

## 2011-05-18 MED ORDER — ONDANSETRON HCL 8 MG PO TABS: 8.0000 mg | ORAL_TABLET | Freq: Three times a day (TID) | ORAL | Status: AC | PRN

## 2011-05-18 MED ORDER — SODIUM CHLORIDE 0.9 % IR SOLN
Status: DC | PRN
  Administered 2011-05-18: 1000 mL

## 2011-05-18 MED ORDER — FENTANYL CITRATE 0.05 MG/ML IJ SOLN
25.0000 ug | INTRAMUSCULAR | Status: DC | PRN
  Administered 2011-05-18 (×4): 50 ug via INTRAVENOUS

## 2011-05-18 MED ORDER — FENTANYL CITRATE 0.05 MG/ML IJ SOLN
INTRAMUSCULAR | Status: AC
  Filled 2011-05-18: qty 2

## 2011-05-18 MED ORDER — DEXTROSE 5 % IV SOLN
INTRAVENOUS | Status: DC | PRN
  Administered 2011-05-18: 500 mL via INTRAVENOUS

## 2011-05-18 MED ORDER — HYDROCODONE-ACETAMINOPHEN 5-500 MG PO TABS: 1.0000 | ORAL_TABLET | Freq: Four times a day (QID) | ORAL | Status: AC | PRN

## 2011-05-18 MED ORDER — CEFAZOLIN SODIUM 1-5 GM-% IV SOLN
INTRAVENOUS | Status: AC
  Filled 2011-05-18: qty 50

## 2011-05-18 MED ORDER — KETOROLAC TROMETHAMINE 30 MG/ML IJ SOLN
INTRAMUSCULAR | Status: AC
  Filled 2011-05-18: qty 1

## 2011-05-18 SURGICAL SUPPLY — 28 items
BAG DECANTER FOR FLEXI CONT (MISCELLANEOUS) ×2 IMPLANT
BAG HAMPER (MISCELLANEOUS) ×2 IMPLANT
CATH THERMACHOICE III (CATHETERS) ×2 IMPLANT
CLOTH BEACON ORANGE TIMEOUT ST (SAFETY) ×2 IMPLANT
COVER SURGICAL LIGHT HANDLE (MISCELLANEOUS) ×4 IMPLANT
FORMALIN 10 PREFIL 120ML (MISCELLANEOUS) ×1 IMPLANT
GAUZE SPONGE 4X4 16PLY XRAY LF (GAUZE/BANDAGES/DRESSINGS) ×2 IMPLANT
GLOVE BIOGEL PI IND STRL 8 (GLOVE) ×1 IMPLANT
GLOVE BIOGEL PI INDICATOR 8 (GLOVE) ×1
GLOVE ECLIPSE 6.5 STRL STRAW (GLOVE) ×1 IMPLANT
GLOVE ECLIPSE 8.0 STRL XLNG CF (GLOVE) ×3 IMPLANT
GLOVE INDICATOR 7.0 STRL GRN (GLOVE) ×2 IMPLANT
GOWN STRL REIN XL XLG (GOWN DISPOSABLE) ×4 IMPLANT
INST SET HYSTEROSCOPY (KITS) ×2 IMPLANT
IV D5W 500ML (IV SOLUTION) ×2 IMPLANT
IV NS IRRIG 3000ML ARTHROMATIC (IV SOLUTION) ×2 IMPLANT
KIT ROOM TURNOVER APOR (KITS) ×2 IMPLANT
MANIFOLD NEPTUNE II (INSTRUMENTS) ×2 IMPLANT
MARKER SKIN DUAL TIP RULER LAB (MISCELLANEOUS) ×2 IMPLANT
NS IRRIG 1000ML POUR BTL (IV SOLUTION) ×2 IMPLANT
PACK BASIC III (CUSTOM PROCEDURE TRAY) ×2
PACK SRG BSC III STRL LF ECLPS (CUSTOM PROCEDURE TRAY) ×1 IMPLANT
PAD ARMBOARD 7.5X6 YLW CONV (MISCELLANEOUS) ×2 IMPLANT
PAD TELFA 3X4 1S STER (GAUZE/BANDAGES/DRESSINGS) ×2 IMPLANT
SET BASIN LINEN APH (SET/KITS/TRAYS/PACK) ×2 IMPLANT
SET IRRIG Y TYPE TUR BLADDER L (SET/KITS/TRAYS/PACK) ×2 IMPLANT
SHEET LAVH (DRAPES) ×2 IMPLANT
YANKAUER SUCT BULB TIP 10FT TU (MISCELLANEOUS) ×2 IMPLANT

## 2011-05-18 NOTE — Transfer of Care (Signed)
Immediate Anesthesia Transfer of Care Note  Patient: [name] [name]  Procedure(s) Performed: Procedure(s) (LRB): DILATATION & CURETTAGE/HYSTEROSCOPY WITH THERMACHOICE ABLATION (N/A)  Patient Location: PACU  Anesthesia Type: General  Level of Consciousness: awake, alert  and oriented  Airway & Oxygen Therapy: Patient Spontanous Breathing and Patient connected to face mask oxygen  Post-op Assessment: Report given to PACU RN  Post vital signs: Reviewed and stable  Complications: No apparent anesthesia complications

## 2011-05-18 NOTE — Anesthesia Procedure Notes (Signed)
Procedure Name: LMA Insertion Date/Time: 05/18/2011 7:57 AM Performed by: Glynn Octave E Pre-anesthesia Checklist: Patient identified, Patient being monitored, Emergency Drugs available, Timeout performed and Suction available Patient Re-evaluated:Patient Re-evaluated prior to inductionOxygen Delivery Method: Circle System Utilized Preoxygenation: Pre-oxygenation with 100% oxygen Intubation Type: IV induction Ventilation: Mask ventilation without difficulty LMA: LMA inserted LMA Size: 4.0 Number of attempts: 1 Placement Confirmation: positive ETCO2 and breath sounds checked- equal and bilateral

## 2011-05-18 NOTE — Anesthesia Postprocedure Evaluation (Signed)
  Anesthesia Post-op Note  Patient: [name] [name]  Procedure(s) Performed: Procedure(s) (LRB): DILATATION & CURETTAGE/HYSTEROSCOPY WITH THERMACHOICE ABLATION (N/A)  Patient Location: PACU  Anesthesia Type: General  Level of Consciousness: awake, alert  and oriented  Airway and Oxygen Therapy: Patient Spontanous Breathing and Patient connected to face mask oxygen  Post-op Pain: none  Post-op Assessment: Post-op Vital signs reviewed, Patient's Cardiovascular Status Stable, Respiratory Function Stable, Patent Airway and No signs of Nausea or vomiting  Post-op Vital Signs: Reviewed and stable  Complications: No apparent anesthesia complications

## 2011-05-18 NOTE — H&P (Signed)
[name] [name] is an 26 y.o. 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  Sonogram normal.  Patient wants to proceed with endometrial ablation.  Menstrual History:  Patient's last menstrual period was 05/12/2011.    Past Medical History  Diagnosis Date  . PONV (postoperative nausea and vomiting)   . Fibromyalgia     Past Surgical History  Procedure Date  . Tubal ligation     coils in fallopian tubes  . Tonsillectomy     Family History  Problem Relation Age of Onset  . Anesthesia problems Neg Hx   . Hypotension Neg Hx   . Malignant hyperthermia Neg Hx   . Pseudochol deficiency Neg Hx     Social History:  reports that she has been smoking Cigarettes.  She has a 15 pack-year smoking history. She does not have any smokeless tobacco history on file. She reports that she drinks alcohol. She reports that she uses illicit drugs (Marijuana).  Allergies: No Known Allergies  Prescriptions prior to admission  Medication Sig Dispense Refill  . Cod Liver Oil CAPS Take 1 capsule by mouth every morning.      . Evening Primrose Oil 1000 MG CAPS Take 1 capsule by mouth every morning.      Marland Kitchen Spirulina 500 MG TABS Take 1 tablet by mouth 3 (three) times daily.        ROS  Review of Systems  Constitutional: Negative for fever, chills, weight loss, malaise/fatigue and diaphoresis.  HENT: Negative for hearing loss, ear pain, nosebleeds, congestion, sore throat, neck pain, tinnitus and ear discharge.   Eyes: Negative for blurred vision, double vision, photophobia, pain, discharge and redness.  Respiratory: Negative for cough, hemoptysis, sputum production, shortness of breath, wheezing and stridor.   Cardiovascular: Negative for chest pain, palpitations, orthopnea, claudication, leg swelling and PND.  Gastrointestinal: Negative for abdominal pain. Negative for heartburn, nausea, vomiting, diarrhea, constipation, blood in stool and melena.    Genitourinary: Negative for dysuria, urgency, frequency, hematuria and flank pain.  Musculoskeletal: Negative for myalgias, back pain, joint pain and falls.  Skin: Negative for itching and rash.  Neurological: Negative for dizziness, tingling, tremors, sensory change, speech change, focal weakness, seizures, loss of consciousness, weakness and headaches.  Endo/Heme/Allergies: Negative for environmental allergies and polydipsia. Does not bruise/bleed easily.  Psychiatric/Behavioral: Negative for depression, suicidal ideas, hallucinations, memory loss and substance abuse. The patient is not nervous/anxious and does not have insomnia.      Blood pressure 126/89, pulse 75, temperature 98.2 F (36.8 C), resp. rate 21, last menstrual period 05/12/2011, SpO2 99.00%. Physical Exam Physical Exam  Vitals reviewed. Constitutional: She is oriented to person, place, and time. She appears well-developed and well-nourished.  HENT:  Head: Normocephalic and atraumatic.  Right Ear: External ear normal.  Left Ear: External ear normal.  Nose: Nose normal.  Mouth/Throat: Oropharynx is clear and moist.  Eyes: Conjunctivae and EOM are normal. Pupils are equal, round, and reactive to light. Right eye exhibits no discharge. Left eye exhibits no discharge. No scleral icterus.  Neck: Normal range of motion. Neck supple. No tracheal deviation present. No thyromegaly present.  Cardiovascular: Normal rate, regular rhythm, normal heart sounds and intact distal pulses.  Exam reveals no gallop and no friction rub.   No murmur heard. Respiratory: Effort normal and breath sounds normal. No respiratory distress. She has no wheezes. She has no rales. She exhibits no tenderness.  GI: Soft. Bowel sounds are normal.  She exhibits no distension and no mass. There is tenderness. There is no rebound and no guarding.  Genitourinary:       Vulva is normal without lesions Vagina is pink moist without discharge Cervix normal in  appearance and pap is normal Uterus is normal by sonogram Adnexa is negative with normal sized ovaries by sonogram  Musculoskeletal: Normal range of motion. She exhibits no edema and no tenderness.  Neurological: She is alert and oriented to person, place, and time. She has normal reflexes. She displays normal reflexes. No cranial nerve deficit. She exhibits normal muscle tone. Coordination normal.  Skin: Skin is warm and dry. No rash noted. No erythema. No pallor.  Psychiatric: She has a normal mood and affect. Her behavior is normal. Judgment and thought content normal.     Results for orders placed during the hospital encounter of 05/18/11 (from the past 24 hour(s))  PREGNANCY, URINE     Status: Normal   Collection Time   05/18/11  7:29 AM      Component Value Range   Preg Test, Ur NEGATIVE  NEGATIVE       Assessment/Plan: 1.  Menometrorrhagia 2.  Dysmenorrhea  Hysteroscopy D&C endometrial ablation.  Dejanay Wamboldt H 05/18/2011, 7:41 AM

## 2011-05-18 NOTE — Discharge Instructions (Signed)
Endometrial Ablation Endometrial ablation removes the lining of the uterus (endometrium). It is usually a same day, outpatient treatment. Ablation helps avoid major surgery (such as a hysterectomy). A hysterectomy is removal of the cervix and uterus. Endometrial ablation has less risk and complications, has a shorter recovery period and is less expensive. After endometrial ablation, most women will have little or no menstrual bleeding. You may not keep your fertility. Pregnancy is no longer likely after this procedure but if you are pre-menopausal, you still need to use a reliable method of birth control following the procedure because pregnancy can occur. REASONS TO HAVE THE PROCEDURE MAY INCLUDE:  Heavy periods.   Bleeding that is causing anemia.   Anovulatory bleeding, very irregular, bleeding.   Bleeding submucous fibroids (on the lining inside the uterus) if they are smaller than 3 centimeters.  REASONS NOT TO HAVE THE PROCEDURE MAY INCLUDE:  You wish to have more children.   You have a pre-cancerous or cancerous problem. The cause of any abnormal bleeding must be diagnosed before having the procedure.   You have pain coming from the uterus.   You have a submucus fibroid larger than 3 centimeters.   You recently had a baby.   You recently had an infection in the uterus.   You have a severe retro-flexed, tipped uterus and cannot insert the instrument to do the ablation.   You had a Cesarean section or deep major surgery on the uterus.   The inner cavity of the uterus is too large for the endometrial ablation instrument.  RISKS AND COMPLICATIONS   Perforation of the uterus.   Bleeding.   Infection of the uterus, bladder or vagina.   Injury to surrounding organs.   Cutting the cervix.   An air bubble to the lung (air embolus).   Pregnancy following the procedure.   Failure of the procedure to help the problem requiring hysterectomy.   Decreased ability to diagnose  cancer in the lining of the uterus.  BEFORE THE PROCEDURE  The lining of the uterus must be tested to make sure there is no pre-cancerous or cancer cells present.   Medications may be given to make the lining of the uterus thinner.   Ultrasound may be used to evaluate the size and look for abnormalities of the uterus.   Future pregnancy is not desired.  PROCEDURE  There are different ways to destroy the lining of the uterus.   Resectoscope - radio frequency-alternating electric current is the most common one used.   Cryotherapy - freezing the lining of the uterus.   Heated Free Liquid - heated salt (saline) solution inserted into the uterus.   Microwave - uses high energy microwaves in the uterus.   Thermal Balloon - a catheter with a balloon tip is inserted into the uterus and filled with heated fluid.  Your caregiver will talk with you about the method used in this clinic. They will also instruct you on the pros and cons of the procedure. Endometrial ablation is performed along with a procedure called operative hysteroscopy. A narrow viewing tube is inserted through the birth canal (vagina) and through the cervix into the uterus. A tiny camera attached to the viewing tube (hysteroscope) allows the uterine cavity to be shown on a TV monitor during surgery. Your uterus is filled with a harmless liquid to make the procedure easier. The lining of the uterus is then removed. The lining can also be removed with a resectoscope which allows your surgeon   to cut away the lining of the uterus under direct vision. Usually, you will be able to go home within an hour after the procedure. HOME CARE INSTRUCTIONS   Do not drive for 24 hours.   No tampons, douching or intercourse for 2 weeks or until your caregiver approves.   Rest at home for 24 to 48 hours. You may then resume normal activities unless told differently by your caregiver.   Take your temperature two times a day for 4 days, and record  it.   Take any medications your caregiver has ordered, as directed.   Use some form of contraception if you are pre-menopausal and do not want to get pregnant.  Bleeding after the procedure is normal. It varies from light spotting and mildly watery to bloody discharge for 4 to 6 weeks. You may also have mild cramping. Only take over-the-counter or prescription medicines for pain, discomfort, or fever as directed by your caregiver. Do not use aspirin, as this may aggravate bleeding. Frequent urination during the first 24 hours is normal. You will not know how effective your surgery is until at least 3 months after the surgery. SEEK IMMEDIATE MEDICAL CARE IF:   Bleeding is heavier than a normal menstrual cycle.   An oral temperature above 102 F (38.9 C) develops.   You have increasing cramps or pains not relieved with medication or develop belly (abdominal) pain which does not seem to be related to the same area of earlier cramping and pain.   You are light headed, weak or have fainting episodes.   You develop pain in the shoulder strap areas.   You have chest or leg pain.   You have abnormal vaginal discharge.   You have painful urination.  Document Released: 12/11/2003 Document Revised: 01/20/2011 Document Reviewed: 03/10/2007 ExitCare Patient Information 2012 ExitCare, LLCInstructions Following General Anesthetic, Adult A nurse specialized in giving anesthesia (anesthetist) or a doctor specialized in giving anesthesia (anesthesiologist) gave you a medicine that made you sleep while a procedure was performed. For as long as 24 hours following this procedure, you may feel:  Dizzy.   Weak.   Drowsy.  AFTER THE PROCEDURE After surgery, you will be taken to the recovery area where a nurse will monitor your progress. You will be allowed to go home when you are awake, stable, taking fluids well, and without complications. For the first 24 hours following an anesthetic:  Have a  responsible person with you.   Do not drive a car. If you are alone, do not take public transportation.   Do not drink alcohol.   Do not take medicine that has not been prescribed by your caregiver.   Do not sign important papers or make important decisions.   You may resume normal diet and activities as directed.   Change bandages (dressings) as directed.   Only take over-the-counter or prescription medicines for pain, discomfort, or fever as directed by your caregiver.  If you have questions or problems that seem related to the anesthetic, call the hospital and ask for the anesthetist or anesthesiologist on call. SEEK IMMEDIATE MEDICAL CARE IF:   You develop a rash.   You have difficulty breathing.   You have chest pain.   You develop any allergic problems.  Document Released: 05/09/2000 Document Revised: 01/20/2011 Document Reviewed: 12/18/2006 [facility] Patient Information 2012 [place], [place].Marland Kitchen

## 2011-05-18 NOTE — Op Note (Signed)
Preoperative diagnosis: Menometrorrhagia                                        Dysmenorrhea   Postoperative diagnoses: Same as above   Procedure: Hysteroscopy,  endometrial ablation  Surgeon: Despina Hidden MD  Anesthesia: Laryngeal mask airway  Findings: The endometrium was normal. There were no fibroid or other abnormalities.  Description of operation: The patient was taken to the operating room and placed in the supine position. She underwent general anesthesia using the laryngeal mask airway. She was placed in the dorsal lithotomy position and prepped and draped in the usual sterile fashion. A Graves speculum was placed and the anterior cervical lip was grasped with a single-tooth tenaculum. The cervix was dilated serially to allow passage of the hysteroscope. Diagnostic hysteroscopy was performed and was found to be normal. The ThermaChoice 3 endometrial ablation balloon was then used were 16 cc of D5W was required to maintain a pressure of 190-200 mm of mercury throughout the procedure. Toatl therapy time was 9:21.  All of the equipment worked well throughout the procedure. All of the fluid was returned at the end of the procedure. The patient was awakened from anesthesia and taken to the recovery room in good stable condition all counts were correct. She received 1 g of Ancef and 30 mg of Toradol preoperatively. She will be discharged from the recovery room and followed up in the office in 2 weeks.  [name] [name] 05/18/2011 8:41 AM

## 2011-05-18 NOTE — Anesthesia Preprocedure Evaluation (Addendum)
Anesthesia Evaluation  Patient identified by MRN, date of birth, ID band Patient awake    Reviewed: Allergy & Precautions, H&P , NPO status , Patient's Chart, lab work & pertinent test results  History of Anesthesia Complications (+) PONV  Airway Mallampati: II  Neck ROM: Full    Dental  (+) Teeth Intact   Pulmonary Current Smoker (am cough),  breath sounds clear to auscultation        Cardiovascular negative cardio ROS  Rhythm:Regular Rate:Normal     Neuro/Psych    GI/Hepatic   Endo/Other    Renal/GU      Musculoskeletal  (+) Fibromyalgia -  Abdominal   Peds  Hematology   Anesthesia Other Findings   Reproductive/Obstetrics                           Anesthesia Physical Anesthesia Plan  ASA: II  Anesthesia Plan: General   Post-op Pain Management:    Induction: Intravenous  Airway Management Planned: LMA  Additional Equipment:   Intra-op Plan:   Post-operative Plan:   Informed Consent: I have reviewed the patients History and Physical, chart, labs and discussed the procedure including the risks, benefits and alternatives for the proposed anesthesia with the patient or authorized representative who has indicated his/her understanding and acceptance.     Plan Discussed with:   Anesthesia Plan Comments: (Possible LMA if not doing the LTC.   Dr. Despina Hidden will not be doing LTC.)      Anesthesia Quick Evaluation

## 2011-05-31 ENCOUNTER — Emergency Department (HOSPITAL_COMMUNITY)
Admission: EM | Admit: 2011-05-31 | Discharge: 2011-05-31 | Disposition: A | Discharge status: Home / Self Care | Payer: Managed Care, Other (non HMO) | Attending: Emergency Medicine | Admitting: Emergency Medicine

## 2011-05-31 ENCOUNTER — Encounter (HOSPITAL_COMMUNITY): Payer: Self-pay

## 2011-05-31 DIAGNOSIS — R10819 Abdominal tenderness, unspecified site: Secondary | ICD-10-CM | POA: Insufficient documentation

## 2011-05-31 DIAGNOSIS — N939 Abnormal uterine and vaginal bleeding, unspecified: Secondary | ICD-10-CM

## 2011-05-31 DIAGNOSIS — R6883 Chills (without fever): Secondary | ICD-10-CM | POA: Insufficient documentation

## 2011-05-31 DIAGNOSIS — N898 Other specified noninflammatory disorders of vagina: Secondary | ICD-10-CM | POA: Insufficient documentation

## 2011-05-31 DIAGNOSIS — G8918 Other acute postprocedural pain: Secondary | ICD-10-CM

## 2011-05-31 LAB — CBC
HCT: 42.6 % (ref 36.0–46.0)
Hemoglobin: 14.6 g/dL (ref 12.0–15.0)
MCH: 31.5 pg (ref 26.0–34.0)
MCHC: 34.3 g/dL (ref 30.0–36.0)
MCV: 91.8 fL (ref 78.0–100.0)
Platelets: 165 10*3/uL (ref 150–400)
RBC: 4.64 MIL/uL (ref 3.87–5.11)
RDW: 12.4 % (ref 11.5–15.5)
WBC: 7.2 10*3/uL (ref 4.0–10.5)

## 2011-05-31 MED ORDER — KETOROLAC TROMETHAMINE 60 MG/2ML IM SOLN
60.0000 mg | Freq: Once | INTRAMUSCULAR | Status: AC
  Administered 2011-05-31: 60 mg via INTRAMUSCULAR
  Filled 2011-05-31: qty 2

## 2011-05-31 MED ORDER — CIPROFLOXACIN HCL 500 MG PO TABS: 500.0000 mg | ORAL_TABLET | Freq: Two times a day (BID) | ORAL | Status: AC

## 2011-05-31 MED ORDER — CIPROFLOXACIN HCL 250 MG PO TABS
500.0000 mg | ORAL_TABLET | Freq: Once | ORAL | Status: AC
  Administered 2011-05-31: 500 mg via ORAL
  Filled 2011-05-31: qty 2

## 2011-05-31 MED ORDER — KETOROLAC TROMETHAMINE 10 MG PO TABS: 10.0000 mg | ORAL_TABLET | Freq: Three times a day (TID) | ORAL | Status: AC | PRN

## 2011-05-31 MED ORDER — OXYCODONE-ACETAMINOPHEN 5-325 MG PO TABS
1.0000 | ORAL_TABLET | Freq: Once | ORAL | Status: AC
  Administered 2011-05-31: 1 via ORAL
  Filled 2011-05-31: qty 1

## 2011-05-31 NOTE — Discharge Instructions (Signed)
Take your next dose of ciprofloxacin tonight as discussed.  Call Dr. Forestine Chute office for a recheck appointment within the next week.  Get seen sooner for any worsened symptoms, particularly increased fever or pain.

## 2011-05-31 NOTE — ED Provider Notes (Signed)
History     CSN: [id number]  Arrival date & time 05/31/11  0803   First MD Initiated Contact with Patient 05/31/11 [phone]      Chief Complaint  Patient presents with  . Vaginal Bleeding    post surgery    (Consider location/radiation/quality/duration/timing/severity/associated sxs/prior treatment) HPI Comments: [name] [name] presents for evaluation of vaginal bleeding.  She is 13 days out from endometrial ablation procedure and she reports having menstrual spotting 2 nights ago along with some vaginal cramping.  She spoke with her gynecologist yesterday on the phone who gave her reassurance, but she was unable to keep her postop followup visit with him which was also yesterday.  Last night she developed increased fleeting in previous vaginal cramping, reporting she still 5 had overnight with bright red blood, no clotting.  She denies fevers, nausea or vomiting, but did have some shaking chills yesterday afternoon which resolved before the vaginal bleeding started.   Patient is a 26 y.o. female presenting with vaginal bleeding. The history is provided by the patient.  Vaginal Bleeding This is a new problem. The current episode started yesterday. The problem occurs constantly. The problem has been unchanged. Associated symptoms include chills. Pertinent negatives include no abdominal pain, arthralgias, chest pain, congestion, fatigue, fever, headaches, joint swelling, nausea, neck pain, numbness, rash, sore throat or weakness. The symptoms are aggravated by nothing. She has tried nothing for the symptoms.    Past Medical History  Diagnosis Date  . PONV (postoperative nausea and vomiting)   . Fibromyalgia     Past Surgical History  Procedure Date  . Tubal ligation     coils in fallopian tubes  . Tonsillectomy   . Endometrial ablation     Family History  Problem Relation Age of Onset  . Anesthesia problems Neg Hx   . Hypotension Neg Hx   . Malignant hyperthermia Neg Hx   .  Pseudochol deficiency Neg Hx     History  Substance Use Topics  . Smoking status: Current Everyday Smoker -- 1.0 packs/day for 15 years    Types: Cigarettes  . Smokeless tobacco: Not on file  . Alcohol Use: Yes     socail    OB History    Grav Para Term Preterm Abortions TAB SAB Ect Mult Living                  Review of Systems  Constitutional: Positive for chills. Negative for fever and fatigue.  HENT: Negative for congestion, sore throat and neck pain.   Eyes: Negative.   Respiratory: Negative for chest tightness and shortness of breath.   Cardiovascular: Negative for chest pain.  Gastrointestinal: Negative for nausea and abdominal pain.  Genitourinary: Positive for vaginal bleeding and pelvic pain.  Musculoskeletal: Negative for joint swelling and arthralgias.  Skin: Negative.  Negative for rash and wound.  Neurological: Negative for dizziness, weakness, light-headedness, numbness and headaches.  Hematological: Negative.   Psychiatric/Behavioral: Negative.     Allergies  Review of patient's allergies indicates no known allergies.  Home Medications   Current Outpatient Rx  Name Route Sig Dispense Refill  . COD LIVER OIL PO CAPS Oral Take 1 capsule by mouth every morning.    Marland Kitchen EVENING PRIMROSE OIL 1000 MG PO CAPS Oral Take 1 capsule by mouth every morning.    Marland Kitchen SPIRULINA 500 MG PO TABS Oral Take 1 tablet by mouth 3 (three) times daily.    Marland Kitchen CIPROFLOXACIN HCL 500 MG PO TABS Oral  Take 1 tablet (500 mg total) by mouth every 12 (twelve) hours. 20 tablet 0  . KETOROLAC TROMETHAMINE 10 MG PO TABS Oral Take 1 tablet (10 mg total) by mouth every 8 (eight) hours as needed for pain. 15 tablet 0    BP 108/74  Pulse 61  Temp(Src) 98.5 F (36.9 C) (Oral)  Resp 18  Ht 5\' 7"  (1.702 m)  Wt 214 lb (97.07 kg)  BMI 33.52 kg/m2  SpO2 100%  LMP 05/04/2011  Physical Exam  Nursing note and vitals reviewed. Constitutional: She is oriented to person, place, and time. She appears  well-developed and well-nourished.  HENT:  Head: Normocephalic and atraumatic.  Eyes: Conjunctivae are normal.  Neck: Normal range of motion.  Cardiovascular: Normal rate, regular rhythm, normal heart sounds and intact distal pulses.   Pulmonary/Chest: Effort normal and breath sounds normal. She has no wheezes.  Abdominal: Soft. Bowel sounds are normal. There is tenderness in the suprapubic area and left lower quadrant. There is no rigidity, no rebound and no guarding.  Genitourinary: There is bleeding around the vagina. No vaginal discharge found.       Watery blood noted in vaginal vault,  Scant.  Modest ttp over midline.  Musculoskeletal: Normal range of motion.  Neurological: She is alert and oriented to person, place, and time.  Skin: Skin is warm and dry.  Psychiatric: She has a normal mood and affect.    ED Course  Procedures (including critical care time)   Labs Reviewed  CBC   No results found.   1. Vaginal bleeding   2. Post-op pain       MDM  Discussed patient with Dr. Archer Asa who recommends Cipro 500 mg twice a day for possible mild metritis  Also recommended Toradol every 8 hours when necessary pain.  These medications have been prescribed for patient.  Followup with Dr. Forestine Chute  office this week.  Patient also advised if she spikes fevers, chills worse pain, nausea vomiting or any worsened symptoms to return sooner for further management.        Candis Musa, PA 05/31/11 1144

## 2011-05-31 NOTE — ED Notes (Signed)
Pt had endometrium ablation done on April 3rd. Pt was scheduled for post follow-up care yesterday and unable to go d/t work schedule. Pt reports, " I did speak with Dr. Despina Hidden on phone yesterday" pt reports having abnormal vaginal bleeding last night, " 5 pads full of blood from 9p last night until 5 or 6 am this morning and lots of pain."

## 2011-06-01 NOTE — ED Provider Notes (Signed)
Medical screening examination/treatment/procedure(s) were performed by non-physician practitioner and as supervising physician I was immediately available for consultation/collaboration.   Benny Lennert, MD 06/01/11 [phone]

## 2012-04-29 ENCOUNTER — Encounter (HOSPITAL_BASED_OUTPATIENT_CLINIC_OR_DEPARTMENT_OTHER): Payer: Self-pay | Admitting: Emergency Medicine

## 2012-04-29 ENCOUNTER — Emergency Department (HOSPITAL_BASED_OUTPATIENT_CLINIC_OR_DEPARTMENT_OTHER)
Admission: EM | Admit: 2012-04-29 | Discharge: 2012-04-29 | Disposition: A | Discharge status: Home / Self Care | Payer: Self-pay | Attending: Emergency Medicine | Admitting: Emergency Medicine

## 2012-04-29 DIAGNOSIS — M25519 Pain in unspecified shoulder: Secondary | ICD-10-CM | POA: Insufficient documentation

## 2012-04-29 DIAGNOSIS — M62838 Other muscle spasm: Secondary | ICD-10-CM | POA: Insufficient documentation

## 2012-04-29 DIAGNOSIS — F17200 Nicotine dependence, unspecified, uncomplicated: Secondary | ICD-10-CM | POA: Insufficient documentation

## 2012-04-29 DIAGNOSIS — R209 Unspecified disturbances of skin sensation: Secondary | ICD-10-CM | POA: Insufficient documentation

## 2012-04-29 MED ORDER — OXYCODONE-ACETAMINOPHEN 5-325 MG PO TABS: 1.0000 | ORAL_TABLET | Freq: Four times a day (QID) | ORAL | Status: DC | PRN

## 2012-04-29 MED ORDER — IBUPROFEN 800 MG PO TABS: 800.0000 mg | ORAL_TABLET | Freq: Three times a day (TID) | ORAL | Status: DC | PRN

## 2012-04-29 MED ORDER — CYCLOBENZAPRINE HCL 10 MG PO TABS: 10.0000 mg | ORAL_TABLET | Freq: Three times a day (TID) | ORAL | Status: DC | PRN

## 2012-04-29 NOTE — ED Notes (Signed)
Pt having right shoulder pain x 3 months.  Pain has worsened over last week.  Pt lifts patients at work.  Some radiation up neck and down right arm.  Some tingling/numbness in right arm.

## 2012-04-29 NOTE — ED Provider Notes (Signed)
History     CSN: [id number]  Arrival date & time 04/29/12  0800   First MD Initiated Contact with Patient 04/29/12 0815      Chief Complaint  Patient presents with  . Shoulder Pain  . Neck Pain    (Consider location/radiation/quality/duration/timing/severity/associated sxs/prior treatment) Patient is a 27 y.o. female presenting with shoulder pain and neck pain.  Shoulder Pain  Neck Pain  Pt reports 3 months of intermittent aching pain in right shoulder is worse in the last week, moderate to severe pain in R shoulder/neck, radiates down her arm and into her head. Associated with tingling in hands. She does lifting at work. Not improved with motrin or topical muscle creams.   Past Medical History  Diagnosis Date  . PONV (postoperative nausea and vomiting)   . Fibromyalgia     Past Surgical History  Procedure Laterality Date  . Tubal ligation      coils in fallopian tubes  . Tonsillectomy    . Endometrial ablation      Family History  Problem Relation Age of Onset  . Anesthesia problems Neg Hx   . Hypotension Neg Hx   . Malignant hyperthermia Neg Hx   . Pseudochol deficiency Neg Hx     History  Substance Use Topics  . Smoking status: Current Every Day Smoker -- 1.00 packs/day for 15 years    Types: Cigarettes  . Smokeless tobacco: Not on file  . Alcohol Use: Yes     Comment: socail    OB History   Grav Para Term Preterm Abortions TAB SAB Ect Mult Living                  Review of Systems  HENT: Positive for neck pain.    All other systems reviewed and are negative except as noted in HPI.   Allergies  Hydrocodone and Oxycodone  Home Medications   Current Outpatient Rx  Name  Route  Sig  Dispense  Refill  . Ginseng 100 MG CAPS   Oral   Take by mouth.         Marland Kitchen ibuprofen (ADVIL,MOTRIN) 200 MG tablet   Oral   Take 200 mg by mouth every 6 (six) hours as needed for pain.         . Multiple Vitamin (MULTIVITAMIN) capsule   Oral   Take 1  capsule by mouth daily.           BP 124/87  Pulse 74  Temp(Src) 98.5 F (36.9 C) (Oral)  Resp 18  SpO2 100%  Physical Exam  Nursing note and vitals reviewed. Constitutional: She is oriented to person, place, and time. She appears well-developed and well-nourished.  HENT:  Head: Normocephalic and atraumatic.  Eyes: EOM are normal. Pupils are equal, round, and reactive to light.  Neck: Normal range of motion. Neck supple.  Cardiovascular: Normal rate, normal heart sounds and intact distal pulses.   Pulmonary/Chest: Effort normal and breath sounds normal.  Abdominal: Bowel sounds are normal. She exhibits no distension. There is no tenderness.  Musculoskeletal: Normal range of motion. She exhibits tenderness (tender over the soft tissues of R shoulder and neck). She exhibits no edema.  Neurological: She is alert and oriented to person, place, and time. She has normal strength. No cranial nerve deficit or sensory deficit.  Skin: Skin is warm and dry. No rash noted.  Psychiatric: She has a normal mood and affect.    ED Course  Procedures (including critical care  time)  Labs Reviewed - No data to display No results found.   1. Muscle spasms of neck       MDM  Muscle spasm in R neck/shoulder. Motrin/Flexeril/Perocet for pain. PCP followup.         Charles B. Bernette Mayers, MD 04/29/12 [phone]

## 2012-05-22 ENCOUNTER — Ambulatory Visit: Payer: Self-pay | Admitting: Obstetrics & Gynecology

## 2013-01-25 ENCOUNTER — Ambulatory Visit: Payer: Self-pay | Admitting: Adult Health

## 2015-04-06 ENCOUNTER — Encounter (HOSPITAL_BASED_OUTPATIENT_CLINIC_OR_DEPARTMENT_OTHER): Payer: Self-pay | Admitting: *Deleted

## 2015-04-06 ENCOUNTER — Emergency Department (HOSPITAL_BASED_OUTPATIENT_CLINIC_OR_DEPARTMENT_OTHER)
Admission: EM | Admit: 2015-04-06 | Discharge: 2015-04-06 | Disposition: A | Discharge status: Home / Self Care | Payer: Self-pay | Attending: Emergency Medicine | Admitting: Emergency Medicine

## 2015-04-06 ENCOUNTER — Emergency Department (HOSPITAL_BASED_OUTPATIENT_CLINIC_OR_DEPARTMENT_OTHER): Payer: Self-pay

## 2015-04-06 DIAGNOSIS — Y9289 Other specified places as the place of occurrence of the external cause: Secondary | ICD-10-CM | POA: Insufficient documentation

## 2015-04-06 DIAGNOSIS — S93402A Sprain of unspecified ligament of left ankle, initial encounter: Secondary | ICD-10-CM | POA: Insufficient documentation

## 2015-04-06 DIAGNOSIS — F17210 Nicotine dependence, cigarettes, uncomplicated: Secondary | ICD-10-CM | POA: Insufficient documentation

## 2015-04-06 DIAGNOSIS — W172XXA Fall into hole, initial encounter: Secondary | ICD-10-CM | POA: Insufficient documentation

## 2015-04-06 DIAGNOSIS — Z8739 Personal history of other diseases of the musculoskeletal system and connective tissue: Secondary | ICD-10-CM | POA: Insufficient documentation

## 2015-04-06 DIAGNOSIS — Y9389 Activity, other specified: Secondary | ICD-10-CM | POA: Insufficient documentation

## 2015-04-06 DIAGNOSIS — Y998 Other external cause status: Secondary | ICD-10-CM | POA: Insufficient documentation

## 2015-04-06 NOTE — ED Notes (Signed)
ICE pack given.

## 2015-04-06 NOTE — ED Provider Notes (Signed)
CSN: [id number]     Arrival date & time 04/06/15  [id number] History   First MD Initiated Contact with Patient 04/06/15 0913     No chief complaint on file.   HPI   BRENNAH HIMELRIGHT is a 30 y.o. female with a PMH of fibromyalgia who presents to the ED with left ankle pain, which she states started on Saturday after she stepped in a hole in her yard and rolled her ankle. She reports swelling and intermittent pain since that time. She notes movement exacerbates her pain. She has tried ibuprofen and RICE for her symptoms. She notes initially, she felt numbness and paresthesia, however states this is now resolved.    Past Medical History  Diagnosis Date  . PONV (postoperative nausea and vomiting)   . Fibromyalgia    Past Surgical History  Procedure Laterality Date  . Tubal ligation      coils in fallopian tubes  . Tonsillectomy    . Endometrial ablation     Family History  Problem Relation Age of Onset  . Anesthesia problems Neg Hx   . Hypotension Neg Hx   . Malignant hyperthermia Neg Hx   . Pseudochol deficiency Neg Hx    Social History  Substance Use Topics  . Smoking status: Current Every Day Smoker -- 1.00 packs/day for 15 years    Types: Cigarettes  . Smokeless tobacco: None  . Alcohol Use: Yes     Comment: socail   OB History    No data available      Review of Systems  Musculoskeletal: Positive for joint swelling and arthralgias.  Skin: Negative for color change.  Neurological: Positive for numbness. Negative for weakness.      Allergies  Hydrocodone and Oxycodone  Home Medications   Prior to Admission medications   Not on File    BP 132/89 mmHg  Pulse 80  Temp(Src) 98.1 F (36.7 C) (Oral)  Resp 16  Ht 5\' 7"  (1.702 m)  Wt 113.399 kg  BMI 39.15 kg/m2  SpO2 98%  LMP 03/30/2015 Physical Exam  Constitutional: She is oriented to person, place, and time. She appears well-developed and well-nourished. No distress.  HENT:  Head: Normocephalic and  atraumatic.  Right Ear: External ear normal.  Left Ear: External ear normal.  Nose: Nose normal.  Eyes: Conjunctivae and EOM are normal. Right eye exhibits no discharge. Left eye exhibits no discharge. No scleral icterus.  Neck: Normal range of motion. Neck supple.  Cardiovascular: Normal rate, regular rhythm and intact distal pulses.   Pulmonary/Chest: Effort normal and breath sounds normal. No respiratory distress.  Musculoskeletal: She exhibits edema and tenderness.  Mild edema to left ankle with TTP over lateral malleolus. No erythema or heat. Decreased ROM with inversion and eversion of left ankle due to pain.  Neurological: She is alert and oriented to person, place, and time. She has normal strength. No sensory deficit.  Skin: Skin is warm and dry. She is not diaphoretic. No erythema.  Psychiatric: She has a normal mood and affect. Her behavior is normal.  Nursing note and vitals reviewed.   ED Course  Procedures (including critical care time)  Labs Review Labs Reviewed - No data to display  Imaging Review Dg Ankle Complete Left  04/06/2015  CLINICAL DATA:  30 year old female with fall and left back and pain. EXAM: LEFT ANKLE COMPLETE - 3+ VIEW COMPARISON:  None. FINDINGS: There is no acute fracture or dislocation. The ankle mortise is intact. The bones are  well mineralized. There is soft tissue swelling of the ankle. No radiopaque foreign object identified. IMPRESSION: No acute fracture or dislocation. Electronically Signed   By: Anner Crete M.D.   On: 04/06/2015 09:31   I have personally reviewed and evaluated these images as part of my medical decision-making.   EKG Interpretation None      MDM   Final diagnoses:  Left ankle sprain, initial encounter    30 year old female presents with left ankle pain after stepping in a hole and rolling her ankle on Saturday. Patient is afebrile. Vital signs stable. Mild edema to left ankle with TTP over lateral malleolus. No  erythema or heat. Decreased ROM with inversion and eversion of left ankle due to pain. Patient is neurovascularly intact. Imaging negative for fracture or dislocation. Symptoms likely due to ankle sprain. Will place in ACE wrap and advised to rest, ice, elevate, and take ibuprofen for pain. Patient to follow-up with ortho for persistent symptoms. Return precautions discussed. Patient verbalizes her understanding and is in agreement with plan.  BP 132/89 mmHg  Pulse 80  Temp(Src) 98.1 F (36.7 C) (Oral)  Resp 16  Ht 5\' 7"  (1.702 m)  Wt 113.399 kg  BMI 39.15 kg/m2  SpO2 98%  LMP 03/30/2015     Marella Chimes, PA-C 04/06/15 1146  Charlesetta Shanks, MD 04/06/15 [phone]

## 2015-04-06 NOTE — Discharge Instructions (Signed)
1. Medications: ibuprofen for pain, usual home medications 2. Treatment: rest, drink plenty of fluids, ice, elevate, wear ace wrap for comfort 3. Follow Up: please followup with your primary doctor and with orthopedics for persistent symptoms for discussion of your diagnoses and further evaluation after today's visit; please return to the ER for increased pain or swelling, new or worsening symptoms   Ankle Sprain An ankle sprain is an injury to the strong, fibrous tissues (ligaments) that hold the bones of your ankle joint together.  CAUSES An ankle sprain is usually caused by a fall or by twisting your ankle. Ankle sprains most commonly occur when you step on the outer edge of your foot, and your ankle turns inward. People who participate in sports are more prone to these types of injuries.  SYMPTOMS   Pain in your ankle. The pain may be present at rest or only when you are trying to stand or walk.  Swelling.  Bruising. Bruising may develop immediately or within 1 to 2 days after your injury.  Difficulty standing or walking, particularly when turning corners or changing directions. DIAGNOSIS  Your caregiver will ask you details about your injury and perform a physical exam of your ankle to determine if you have an ankle sprain. During the physical exam, your caregiver will press on and apply pressure to specific areas of your foot and ankle. Your caregiver will try to move your ankle in certain ways. An X-ray exam may be done to be sure a bone was not broken or a ligament did not separate from one of the bones in your ankle (avulsion fracture).  TREATMENT  Certain types of braces can help stabilize your ankle. Your caregiver can make a recommendation for this. Your caregiver may recommend the use of medicine for pain. If your sprain is severe, your caregiver may refer you to a surgeon who helps to restore function to parts of your skeletal system (orthopedist) or a physical therapist. [place] ice to your injury for 1-2 days or as directed by your caregiver. Applying ice helps to reduce inflammation and pain.  Put ice in a plastic bag.  Place a towel between your skin and the bag.  Leave the ice on for 15-20 minutes at a time, every 2 hours while you are awake.  Only take over-the-counter or prescription medicines for pain, discomfort, or fever as directed by your caregiver.  Elevate your injured ankle above the level of your heart as much as possible for 2-3 days.  If your caregiver recommends crutches, use them as instructed. Gradually put weight on the affected ankle. Continue to use crutches or a cane until you can walk without feeling pain in your ankle.  If you have a plaster splint, wear the splint as directed by your caregiver. Do not rest it on anything harder than a pillow for the first 24 hours. Do not put weight on it. Do not get it wet. You may take it off to take a shower or bath.  You may have been given an elastic bandage to wear around your ankle to provide support. If the elastic bandage is too tight (you have numbness or tingling in your foot or your foot becomes cold and blue), adjust the bandage to make it comfortable.  If you have an air splint, you may blow more air into it or let air out to make it more comfortable. You may take your splint off at night and before  taking a shower or bath. Wiggle your toes in the splint several times per day to decrease swelling. SEEK MEDICAL CARE IF:   You have rapidly increasing bruising or swelling.  Your toes feel extremely cold or you lose feeling in your foot.  Your pain is not relieved with medicine. SEEK IMMEDIATE MEDICAL CARE IF:  Your toes are numb or blue.  You have severe pain that is increasing. MAKE SURE YOU:   Understand these instructions.  Will watch your condition.  Will get help right away if you are not doing well or get worse.   This information is not intended  to replace advice given to you by your health care provider. Make sure you discuss any questions you have with your health care provider.   Document Released: 01/31/2005 Document Revised: 02/21/2014 Document Reviewed: 02/12/2011 Elsevier Interactive Patient Education [employer].

## 2015-04-06 NOTE — ED Notes (Signed)
States she fell in hole in yard on Saturday.  Swelling to left ankle. No other injury.

## 2015-04-06 NOTE — ED Notes (Signed)
Verbal order from Bernerd Limbo to change ortho order from ASO ankle brace to ACE wrap.

## 2015-06-25 ENCOUNTER — Ambulatory Visit (INDEPENDENT_AMBULATORY_CARE_PROVIDER_SITE_OTHER): Payer: Self-pay | Admitting: Adult Health

## 2015-06-25 ENCOUNTER — Encounter: Payer: Self-pay | Admitting: Adult Health

## 2015-06-25 VITALS — BP 138/80 | HR 80 | Ht 68.0 in | Wt 287.4 lb

## 2015-06-25 DIAGNOSIS — Z3202 Encounter for pregnancy test, result negative: Secondary | ICD-10-CM

## 2015-06-25 DIAGNOSIS — L918 Other hypertrophic disorders of the skin: Secondary | ICD-10-CM

## 2015-06-25 DIAGNOSIS — Q828 Other specified congenital malformations of skin: Secondary | ICD-10-CM

## 2015-06-25 DIAGNOSIS — B079 Viral wart, unspecified: Secondary | ICD-10-CM

## 2015-06-25 DIAGNOSIS — D235 Other benign neoplasm of skin of trunk: Secondary | ICD-10-CM

## 2015-06-25 HISTORY — DX: Viral wart, unspecified: B07.9

## 2015-06-25 HISTORY — DX: Other specified congenital malformations of skin: Q82.8

## 2015-06-25 LAB — POCT URINE PREGNANCY: Preg Test, Ur: NEGATIVE

## 2015-06-25 MED ORDER — IMIQUIMOD 5 % EX CREA: TOPICAL_CREAM | CUTANEOUS | Status: DC

## 2015-06-25 NOTE — Patient Instructions (Addendum)
Genital Warts Genital warts are a common STD (sexually transmitted disease). They may appear as small bumps on the tissues of the genital area or anal area. Sometimes, they can become irritated and cause pain. Genital warts are easily passed to other people through sexual contact. Getting treatment is important because genital warts can lead to other problems. In females, the virus that causes genital warts may increase the risk of cervical cancer. CAUSES Genital warts are caused by a virus that is called human papillomavirus (HPV). HPV is spread by having unprotected sex with an infected person. It can be spread through vaginal, anal, and oral sex. Many people do not know that they are infected. They may be infected for years without problems. However, even if they do not have problems, they can pass the infection to their sexual partners. RISK FACTORS Genital warts are more likely to develop in:  People who have unprotected sex.  People who have multiple sexual partners.  People who become sexually active before they are 30 years of age.  Men who are not circumcised.  Women who have a female sexual partner who is not circumcised.  People who have a weakened body defense system (immune system) due to disease or medicine.  People who smoke. SYMPTOMS Symptoms of genital warts include:  Small growths in the genital area or anal area. These warts often grow in clusters.  Itching and irritation in the genital area or anal area.  Bleeding from the warts.  Painful sexual intercourse. DIAGNOSIS Genital warts can usually be diagnosed from their appearance on the vagina, vulva, penis, perineum, anus, or rectum. Tests may also be done, such as:  Biopsy. A tissue sample is removed so it can be looked at under a microscope.  Colposcopy. In females, a magnifying tool is used to examine the vagina and cervix. Certain solutions may be used to make the HPV cells change color so they can be seen more  easily.  A Pap test in females.  Tests for other STDs. TREATMENT Treatment for genital warts may include:  Applying prescription medicines to the warts. These may be solutions or creams.  Freezing the warts with liquid nitrogen (cryotherapy).  Burning the warts with:  Laser treatment.  An electrified probe (electrocautery).  Injecting a substance (Candida antigen or Trichophyton antigen) into the warts to help the body's immune system to fight off the warts.  Interferon injections.  Surgery to remove the warts. HOME CARE INSTRUCTIONS Medicines  Apply over-the-counter and prescription medicines only as told by your health care provider.  Do not treat genital warts with medicines that are used for treating hand warts.  Talk with your health care provider about using over-the-counter anti-itch creams. General Instructions  Do not touch or scratch the warts.  Do not have sex until your treatment has been completed.  Tell your current and past sexual partners about your condition because they may also need treatment.  Keep all follow-up visits as told by your health care provider. This is important.  After treatment, use condoms during sex to prevent future infections. Other Instructions for Women  Women who have genital warts might need increased screening for cervical cancer. This type of cancer is slow growing and can be cured if it is found early. Chances of developing cervical cancer are increased with HPV.  If you become pregnant, tell your health care provider that you have had HPV. Your health care provider will monitor you closely during pregnancy to be sure that your  baby is safe. PREVENTION Talk with your health care provider about getting the HPV vaccines. These vaccines prevent some HPV infections and cancers. It is recommended that the vaccine be given to males and females who are 92-45 years of age. It will not work if you already have HPV, and it is not  recommended for pregnant women. SEEK MEDICAL CARE IF:  You have redness, swelling, or pain in the area of the treated skin.  You have a fever.  You feel generally ill.  You feel lumps in and around your genital area or anal area.  You have bleeding in your genital area or anal area.  You have pain during sexual intercourse.   This information is not intended to replace advice given to you by your health care provider. Make sure you discuss any questions you have with your health care provider.   Document Released: 01/29/2000 Document Revised: 10/22/2014 Document Reviewed: 04/28/2014 Elsevier Interactive Patient Education 2016 Elsevier Inc. Use aldara 3 x weekly to wart Follow up for pap  Apply for family planning medicaid

## 2015-06-25 NOTE — Progress Notes (Addendum)
Subjective:     Patient ID: [name] [name], female   DOB: Sep 09, 1985, 30 y.o.   MRN: [id number]  HPI [name] [name] is a 30 year old white female, in complaining of skin tags in peri area and ?mole. Had endometrial ablation in 2013 and periods started back in about year, and they last 5-7 days but not painful or heavy.She is a self pay pt.  Review of Systems  Has skin tags and ?moles in peri area, periods resumed after ablation Reviewed past medical,surgical, social and family history. Reviewed medications and allergies.     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   Also has small brown mole left buttock, to watch for now.  Assessment:     Skin tags Wart     Plan:    Rx aldara cream 3 x weekly thin amount #12 with 1 refill.review handout on warts  Skin tags sent to pathology  Keep clean and dry Follow up for pap and physical when gets family planning medicaid

## 2015-06-26 NOTE — Addendum Note (Signed)
Addended by: Linton Rump on: 06/26/2015 08:47 AM   Modules accepted: Orders

## 2015-07-06 ENCOUNTER — Telehealth: Payer: Self-pay | Admitting: Adult Health

## 2015-07-06 DIAGNOSIS — Q828 Other specified congenital malformations of skin: Secondary | ICD-10-CM

## 2015-07-06 NOTE — Telephone Encounter (Signed)
Left message that skin tag pathology showed squamous papilloma, benign

## 2015-07-22 ENCOUNTER — Ambulatory Visit (INDEPENDENT_AMBULATORY_CARE_PROVIDER_SITE_OTHER): Payer: Medicaid Other | Admitting: Adult Health

## 2015-07-22 ENCOUNTER — Other Ambulatory Visit (HOSPITAL_COMMUNITY)
Admission: RE | Admit: 2015-07-22 | Discharge: 2015-07-22 | Disposition: A | Discharge status: Home / Self Care | Payer: Managed Care, Other (non HMO) | Source: Ambulatory Visit | Attending: Adult Health | Admitting: Adult Health

## 2015-07-22 ENCOUNTER — Encounter: Payer: Self-pay | Admitting: Adult Health

## 2015-07-22 VITALS — BP 122/78 | HR 92 | Ht 67.0 in | Wt 287.5 lb

## 2015-07-22 DIAGNOSIS — Z1151 Encounter for screening for human papillomavirus (HPV): Secondary | ICD-10-CM | POA: Insufficient documentation

## 2015-07-22 DIAGNOSIS — Z113 Encounter for screening for infections with a predominantly sexual mode of transmission: Secondary | ICD-10-CM | POA: Insufficient documentation

## 2015-07-22 DIAGNOSIS — Z308 Encounter for other contraceptive management: Secondary | ICD-10-CM | POA: Diagnosis not present

## 2015-07-22 DIAGNOSIS — Z3009 Encounter for other general counseling and advice on contraception: Secondary | ICD-10-CM

## 2015-07-22 DIAGNOSIS — Z01419 Encounter for gynecological examination (general) (routine) without abnormal findings: Secondary | ICD-10-CM

## 2015-07-22 NOTE — Patient Instructions (Signed)
Physical in 1 year,pap in 3 if normal 

## 2015-07-22 NOTE — Progress Notes (Signed)
Patient ID: [name] [name], female   DOB: 1985/03/26, 30 y.o.   MRN: [id number] History of Present Illness: [name] [name] is a 30 year old white female in for a well woman gyn exam and pap.   Current Medications, Allergies, Past Medical History, Past Surgical History, Family History and Social History were reviewed in [employer] record.     Review of Systems: Patient denies any headaches, hearing loss, fatigue, blurred vision, shortness of breath, chest pain, abdominal pain, problems with bowel movements, urination, or intercourse. No joint pain or mood swings.She has Essure.    Physical Exam:BP 122/78 mmHg  Pulse 92  Ht 5\' 7"  (1.702 m)  Wt 287 lb 8 oz (130.409 kg)  BMI 45.02 kg/m2  LMP 07/14/2015 General:  Well developed, well nourished, no acute distress Skin:  Warm and dry Neck:  Midline trachea, normal thyroid, good ROM, no lymphadenopathy Lungs; Clear to auscultation bilaterally Breast:  No dominant palpable mass, retraction, or nipple discharge Cardiovascular: Regular rate and rhythm Abdomen:  Soft, non tender, no hepatosplenomegaly Pelvic:  External genitalia is normal in appearance, no lesions.  The vagina is normal in appearance. Urethra has no lesions or masses. The cervix is bulbous. Pap with HPV and GC/CHl performed. Uterus is felt to be normal size, shape, and contour.  No adnexal masses or tenderness noted.Bladder is non tender, no masses felt.Warts resolved and where skin tags removed skin normal. Extremities/musculoskeletal:  No swelling or varicosities noted, no clubbing or cyanosis Psych:  No mood changes, alert and cooperative,seems happy   Impression: Well woman gyn exam and pap Family planning    Plan: Check HIV,RPR and HSV 2 Physical in 1 year, pap in 3 if normal

## 2015-07-23 LAB — RPR: RPR Ser Ql: NONREACTIVE

## 2015-07-23 LAB — HIV ANTIBODY (ROUTINE TESTING W REFLEX): HIV Screen 4th Generation wRfx: NONREACTIVE

## 2015-07-23 LAB — HSV 2 ANTIBODY, IGG: HSV 2 Glycoprotein G Ab, IgG: <0.91 index (ref 0.00–0.90)

## 2015-07-27 LAB — CYTOLOGY - PAP

## 2015-08-03 ENCOUNTER — Telehealth: Payer: Self-pay | Admitting: Adult Health

## 2015-08-03 NOTE — Telephone Encounter (Signed)
Left message x 1. JSY 

## 2015-08-04 ENCOUNTER — Telehealth: Payer: Self-pay | Admitting: Adult Health

## 2015-08-04 NOTE — Telephone Encounter (Signed)
Spoke with pt letting her know pap and all labs were negative. Pt voiced understanding. [address]

## 2015-08-04 NOTE — Telephone Encounter (Signed)
Spoke with pt letting her know pap and all labs were all negative. Pt voiced understanding. [place]

## 2016-08-05 ENCOUNTER — Other Ambulatory Visit: Payer: Medicaid Other | Admitting: Adult Health

## 2016-09-13 ENCOUNTER — Telehealth: Payer: Self-pay | Admitting: Adult Health

## 2016-09-13 NOTE — Telephone Encounter (Signed)
Patient states she noticed a purple vein in her lower left calf abwhile ago that started to bulge and pulsate. It went away for a bit but came back a few days ago but now has pain from her inner left groin down her left leg and has some numbness and tingling as well. No redness or warm areas noted but some swelling in her ankles.  Advised patient to call her primary care doctor but patient states she doesn't have one nor does she have insurance. She actually called to schedule a pap smear but wanted to ask your thoughts. Please advise.

## 2016-09-13 NOTE — Telephone Encounter (Signed)
Patient called stating that she has been having this purple vein that has been showing, patient states that as of this week she has been having pain from the inner part of the leg all the way down, she also stated that she has some swelling on the leg. Patient wants to know if that is normal or does she need to go to the ER. Please contact pt

## 2016-09-15 ENCOUNTER — Encounter: Payer: Self-pay | Admitting: Adult Health

## 2016-09-15 ENCOUNTER — Ambulatory Visit (INDEPENDENT_AMBULATORY_CARE_PROVIDER_SITE_OTHER): Payer: Medicaid Other | Admitting: Adult Health

## 2016-09-15 VITALS — BP 128/90 | HR 64 | Ht 67.0 in | Wt 268.5 lb

## 2016-09-15 DIAGNOSIS — Z01419 Encounter for gynecological examination (general) (routine) without abnormal findings: Secondary | ICD-10-CM | POA: Insufficient documentation

## 2016-09-15 DIAGNOSIS — I8393 Asymptomatic varicose veins of bilateral lower extremities: Secondary | ICD-10-CM

## 2016-09-15 DIAGNOSIS — Z3009 Encounter for other general counseling and advice on contraception: Secondary | ICD-10-CM

## 2016-09-15 DIAGNOSIS — Z309 Encounter for contraceptive management, unspecified: Secondary | ICD-10-CM

## 2016-09-15 NOTE — Progress Notes (Signed)
Patient ID: [name] [name], female   DOB: 1985/03/05, 31 y.o.   MRN: [id number] History of Present Illness: [name] [name] is a 31 year old white female in for a well woman gyn exam, she had normal pap, with negative HPV 07/22/15.   Current Medications, Allergies, Past Medical History, Past Surgical History, Family History and Social History were reviewed in [employer] record.     Review of Systems: Patient denies any headaches, hearing loss, fatigue, blurred vision, shortness of breath, chest pain, abdominal pain, problems with bowel movements, urination, or intercourse. No joint pain or mood swings. Has pain and selling left leg about 1 month ago and again last week, none now    Physical Exam:BP 128/90 (BP Location: Left Arm, Patient Position: Sitting, Cuff Size: Large)   Pulse 64   Ht 5\' 7"  (1.702 m)   Wt 268 lb 8 oz (121.8 kg)   LMP 09/06/2016 (Approximate)   BMI 42.05 kg/m  General:  Well developed, well nourished, no acute distress Skin:  Warm and dry Neck:  Midline trachea, normal thyroid, good ROM, no lymphadenopathy Lungs; Clear to auscultation bilaterally Breast:  No dominant palpable mass, retraction, or nipple discharge Cardiovascular: Regular rate and rhythm Abdomen:  Soft, non tender, no hepatosplenomegaly Pelvic:  External genitalia is normal in appearance, no lesions.  The vagina is normal in appearance. Urethra has no lesions or masses. The cervix is bulbous.  Uterus is felt to be normal size, shape, and contour.  No adnexal masses or tenderness noted.Bladder is non tender, no masses felt. Extremities/musculoskeletal:  No swelling noted, has varicose veins in both legs, has good pulse in left neg and no swelling, no clubbing or cyanosis Psych:  No mood changes, alert and cooperative,seems happy PHQ 2 score 0. She has lost about 20 lbs since last year, praised over her efforts. Discussed that she should see vein specialists, before veins get worse.    Impression: 1. Well woman exam with routine gynecological exam   2. Family planning   3. Varicose veins of both lower extremities       Plan: Sit often, elevated feet, see vein specialists, can try support hose,too  Check HIV and RPR GC/CHL sent Physical in 1 year,pap in 2020

## 2016-09-16 LAB — HIV ANTIBODY (ROUTINE TESTING W REFLEX): HIV Screen 4th Generation wRfx: NONREACTIVE

## 2016-09-16 LAB — RPR: RPR Ser Ql: NONREACTIVE

## 2016-09-18 LAB — GC/CHLAMYDIA PROBE AMP
Chlamydia trachomatis, NAA: NEGATIVE
Neisseria gonorrhoeae by PCR: NEGATIVE

## 2016-09-29 ENCOUNTER — Other Ambulatory Visit: Payer: Medicaid Other | Admitting: Adult Health

## 2019-02-21 ENCOUNTER — Ambulatory Visit: Payer: Managed Care, Other (non HMO) | Attending: Internal Medicine

## 2019-02-21 DIAGNOSIS — Z20822 Contact with and (suspected) exposure to covid-19: Secondary | ICD-10-CM

## 2019-02-23 LAB — NOVEL CORONAVIRUS, NAA: SARS-CoV-2, NAA: NOT DETECTED

## 2019-11-21 ENCOUNTER — Encounter: Payer: Self-pay | Admitting: Adult Health

## 2019-11-21 ENCOUNTER — Other Ambulatory Visit: Payer: Self-pay

## 2019-11-21 ENCOUNTER — Other Ambulatory Visit (HOSPITAL_COMMUNITY)
Admission: RE | Admit: 2019-11-21 | Discharge: 2019-11-21 | Disposition: A | Discharge status: Home / Self Care | Payer: Medicaid Other | Source: Ambulatory Visit | Attending: Adult Health | Admitting: Adult Health

## 2019-11-21 ENCOUNTER — Ambulatory Visit (INDEPENDENT_AMBULATORY_CARE_PROVIDER_SITE_OTHER): Payer: Self-pay | Admitting: Adult Health

## 2019-11-21 VITALS — BP 138/97 | HR 80 | Ht 67.0 in | Wt 303.0 lb

## 2019-11-21 DIAGNOSIS — Z01419 Encounter for gynecological examination (general) (routine) without abnormal findings: Secondary | ICD-10-CM | POA: Insufficient documentation

## 2019-11-21 DIAGNOSIS — F419 Anxiety disorder, unspecified: Secondary | ICD-10-CM | POA: Insufficient documentation

## 2019-11-21 DIAGNOSIS — F32A Depression, unspecified: Secondary | ICD-10-CM | POA: Insufficient documentation

## 2019-11-21 DIAGNOSIS — F410 Panic disorder [episodic paroxysmal anxiety] without agoraphobia: Secondary | ICD-10-CM

## 2019-11-21 DIAGNOSIS — N6311 Unspecified lump in the right breast, upper outer quadrant: Secondary | ICD-10-CM | POA: Insufficient documentation

## 2019-11-21 DIAGNOSIS — R030 Elevated blood-pressure reading, without diagnosis of hypertension: Secondary | ICD-10-CM

## 2019-11-21 DIAGNOSIS — R52 Pain, unspecified: Secondary | ICD-10-CM | POA: Insufficient documentation

## 2019-11-21 DIAGNOSIS — Z6841 Body Mass Index (BMI) 40.0 and over, adult: Secondary | ICD-10-CM

## 2019-11-21 DIAGNOSIS — F17200 Nicotine dependence, unspecified, uncomplicated: Secondary | ICD-10-CM | POA: Insufficient documentation

## 2019-11-21 MED ORDER — CITALOPRAM HYDROBROMIDE 20 MG PO TABS: 20.0000 mg | ORAL_TABLET | Freq: Every day | ORAL | 2 refills | Status: AC

## 2019-11-21 MED ORDER — HYDROXYZINE HCL 10 MG PO TABS: 10.0000 mg | ORAL_TABLET | Freq: Three times a day (TID) | ORAL | 3 refills | Status: AC | PRN
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
## 2019-11-25 LAB — CYTOLOGY - PAP
Chlamydia: NEGATIVE
Comment: NEGATIVE
Comment: NEGATIVE
Comment: NEGATIVE
Comment: NORMAL
Diagnosis: NEGATIVE
HPV 16: NEGATIVE
HPV 18 / 45: NEGATIVE
High risk HPV: POSITIVE — AB
Neisseria Gonorrhea: NEGATIVE

## 2019-11-28 ENCOUNTER — Encounter: Payer: Self-pay | Admitting: Adult Health

## 2019-11-28 DIAGNOSIS — R87810 Cervical high risk human papillomavirus (HPV) DNA test positive: Secondary | ICD-10-CM | POA: Insufficient documentation

## 2019-12-25 ENCOUNTER — Other Ambulatory Visit: Payer: Self-pay

## 2019-12-25 DIAGNOSIS — N6310 Unspecified lump in the right breast, unspecified quadrant: Secondary | ICD-10-CM

## 2019-12-26 ENCOUNTER — Ambulatory Visit: Payer: Medicaid Other

## 2020-01-07 ENCOUNTER — Ambulatory Visit: Payer: No Typology Code available for payment source | Admitting: *Deleted

## 2020-01-07 ENCOUNTER — Ambulatory Visit
Admission: RE | Admit: 2020-01-07 | Discharge: 2020-01-07 | Disposition: A | Discharge status: Home / Self Care | Payer: No Typology Code available for payment source | Source: Ambulatory Visit | Attending: Obstetrics and Gynecology | Admitting: Obstetrics and Gynecology

## 2020-01-07 ENCOUNTER — Other Ambulatory Visit: Payer: Self-pay

## 2020-01-07 ENCOUNTER — Ambulatory Visit
Admission: RE | Admit: 2020-01-07 | Discharge: 2020-01-07 | Disposition: A | Discharge status: Home / Self Care | Payer: Medicaid Other | Source: Ambulatory Visit | Attending: Obstetrics and Gynecology | Admitting: Obstetrics and Gynecology

## 2020-01-07 VITALS — BP 124/84 | Wt 299.7 lb

## 2020-01-07 DIAGNOSIS — Z1239 Encounter for other screening for malignant neoplasm of breast: Secondary | ICD-10-CM

## 2020-01-07 DIAGNOSIS — N6311 Unspecified lump in the right breast, upper outer quadrant: Secondary | ICD-10-CM

## 2020-01-07 DIAGNOSIS — N6310 Unspecified lump in the right breast, unspecified quadrant: Secondary | ICD-10-CM

## 2020-01-07 NOTE — Patient Instructions (Signed)
Explained breast self awareness with Glendon Axe. Patient did not need a Pap smear today due to last Pap smear was 11/21/2019. Patients next Pap smear is due in one year due to her Pap smear was HPV positive. Referred patient to the [facility] for a diagnostic mammogram. Appointment scheduled Tuesday, January 07, 2020 at 1310. Patient aware of appointment and will be there.Discussed smoking cessation with patient. Referred to the [facility] Quitline and gave resources to the free smoking cessation classes at [facility]. Glendon Axe verbalized understanding.  Rande Dario, Arvil [place], RN 8:47 AM

## 2020-01-07 NOTE — Progress Notes (Addendum)
[name] [name] is a 34 y.o. female who presents to [facility] clinic today with complaint of right breast lump since 11/21/2019 that is painful. Patient states the pain is worse at the end of the day. Patient rates the pain at a 2 out of 10. Patient complained of a clear nipple discharge from her right breast one week ago when expressed.  Patient complained of a mass on her pelvis next to her vagina. Explained to patient that BCCCP will not cover the follow-up. Referred patient to the [facility] for [place] for follow-up. Explained the [place] and gave patient application.     Pap Smear: Pap smear not completed today. Last Pap smear was 11/21/2019 at [facility] clinic and was normal with positive HPV. Per patient has history of four abnormal Pap smears since she was 34 years old. Patient states she has a history of two that colposcopies were completed for follow-up and the other two repeat Pap smears. Patient stated her last abnormal Pap smear was around 15 years ago and has had at least three normal Pap smears since. Last Pap smear result is available in Epic.   Physical exam: Breasts Right breast slightly larger than left breast that per patient is normal for her. No skin abnormalities bilateral breasts. No nipple retraction bilateral breasts. No nipple discharge bilateral breasts. Unable to express any nipple discharge from bilateral breasts on exam. No lymphadenopathy. No lumps palpated left breast. Palpated a pea sized lump within the right breast at 11 o'clock 1 cm from the nipple next to the areola. No complaints of pain or tenderness on exam.   Pelvic/Bimanual Pap is not indicated today per BCCCP guidelines.   Smoking History: Patient is a current smoker. Discussed smoking cessation with patient. Referred to the [facility] Quitline and gave resources to the free smoking cessation classes at [facility].   Patient Navigation: Patient education  provided. Access to services provided for patient through BCCCP program.    Breast and Cervical Cancer Risk Assessment: Patient has family history of a maternal aunt having breast cancer. Patient has no known genetic mutations or history of radiation treatment to the chest before age 69. Per patient has history of cervical dysplasia. Patient has no history of being immunocompromised or DES exposure in-utero. Breast cancer risk assessment completed. No breast cancer risk calculated due to patient is less than 68 years old.  Risk Assessment    Risk Scores      01/07/2020   Last edited by: Demetrius Revel, LPN   5-year risk:    Lifetime risk:           A: BCCCP exam without pap smear Complaint of right breast lump and pain.  P: Referred patient to the [facility] for a diagnostic mammogram. Appointment scheduled Tuesday, January 07, 2020 at 1310.  Loletta Parish, RN 01/07/2020 8:47 AM

## 2020-01-16 ENCOUNTER — Ambulatory Visit: Payer: Medicaid Other | Admitting: Adult Health

## 2020-03-05 ENCOUNTER — Encounter: Payer: No Typology Code available for payment source | Admitting: Obstetrics & Gynecology

## 2021-01-04 IMAGING — MG DIGITAL DIAGNOSTIC BILAT W/ TOMO W/ CAD
6 of 10 series · 6 of 30 positions shown · non-contrast
Comparison: None.

CLINICAL DATA: 34-year-old female presenting with a new lump felt
by the patient's position in the upper outer right breast.

EXAM:
DIGITAL DIAGNOSTIC BILATERAL MAMMOGRAM WITH CAD AND TOMO
ULTRASOUND RIGHT BREAST

[L CC synth-2D]
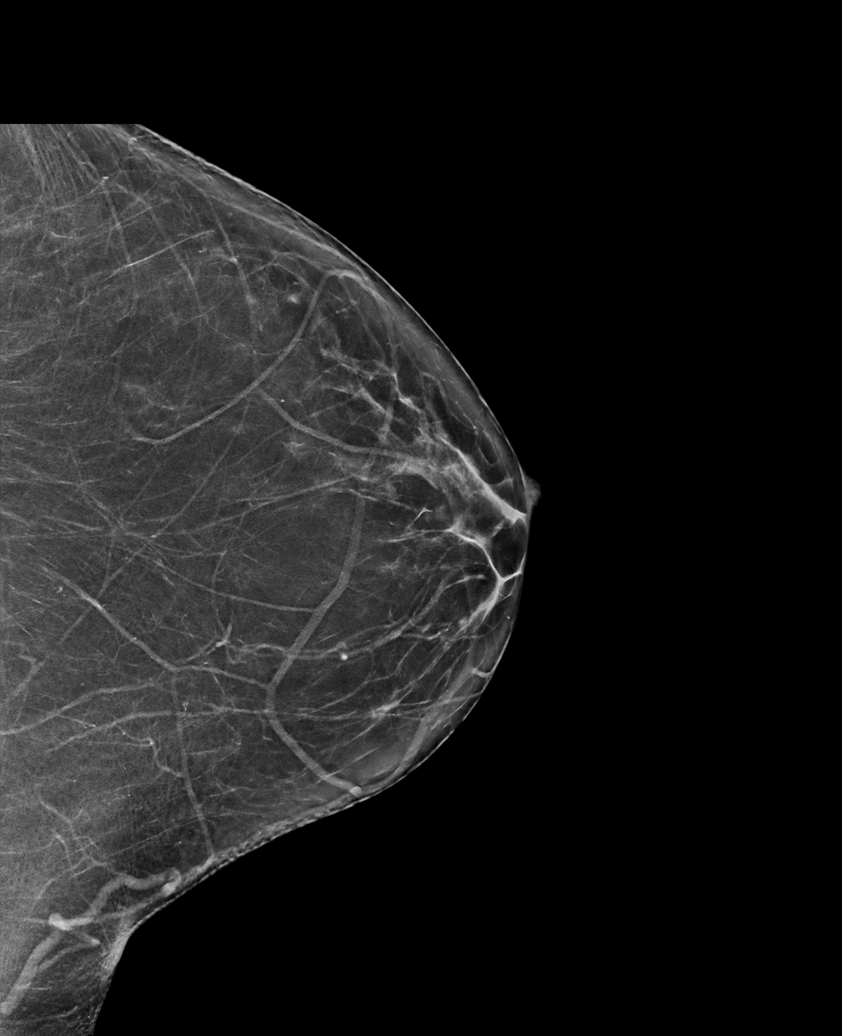

[R TAN synth-2D]
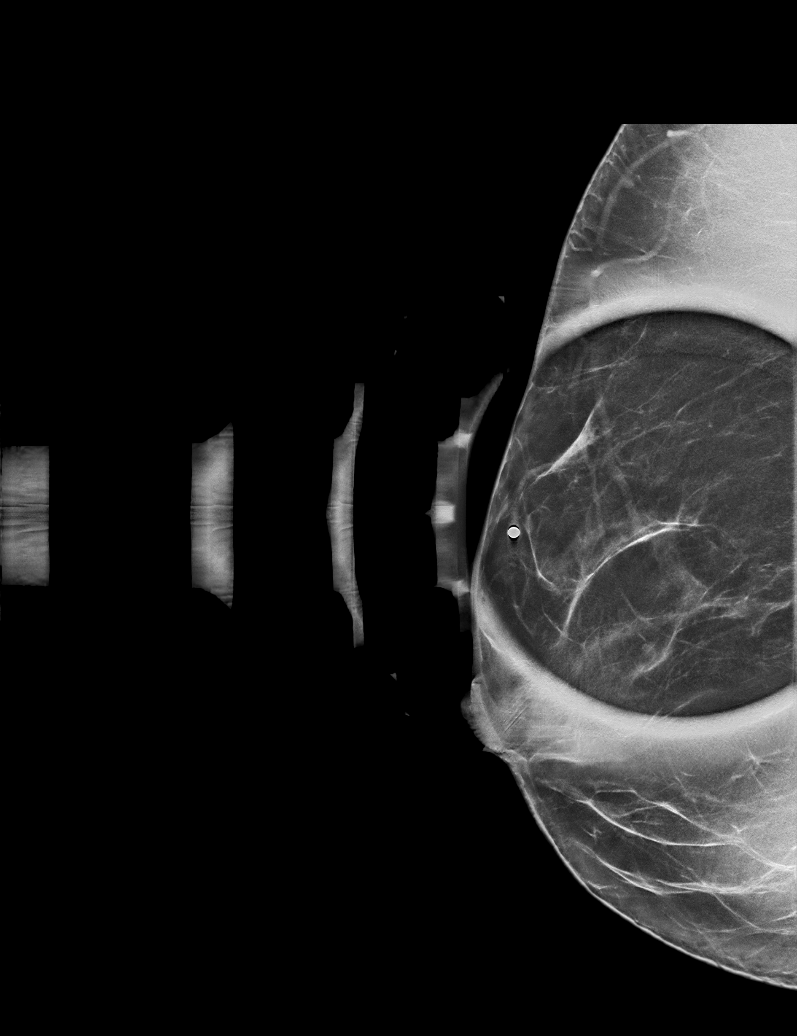

[R MLO synth-2D]
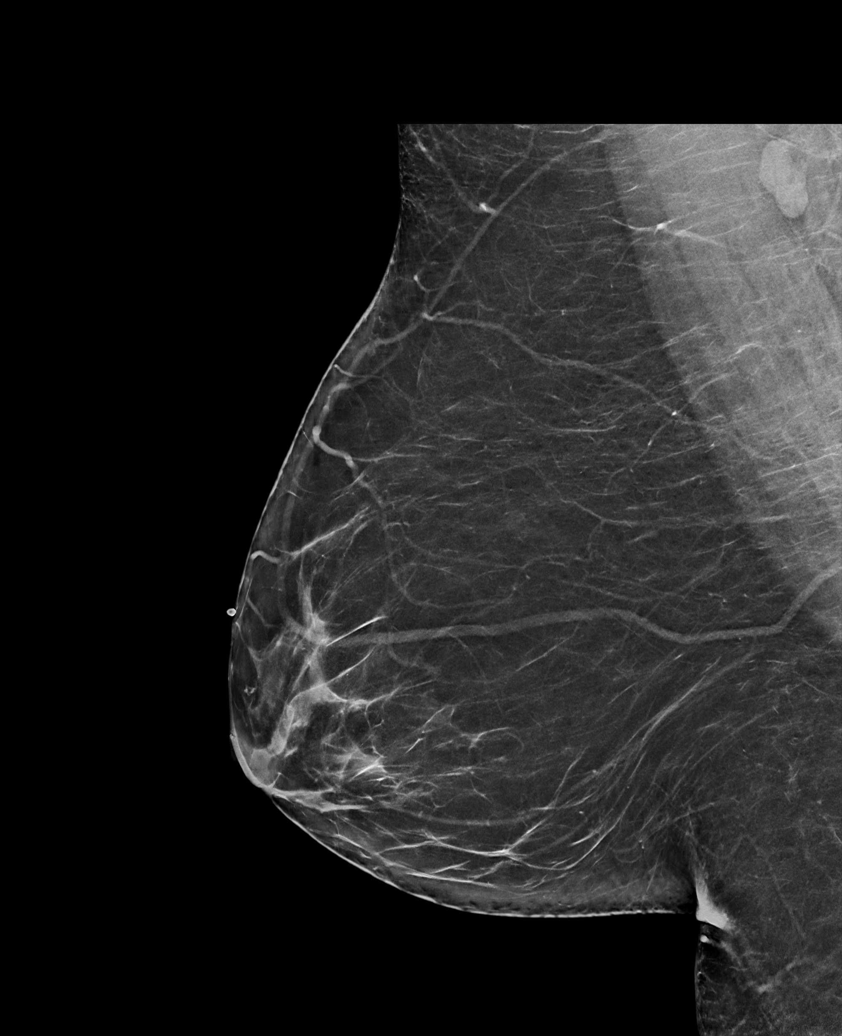

[R CC synth-2D]
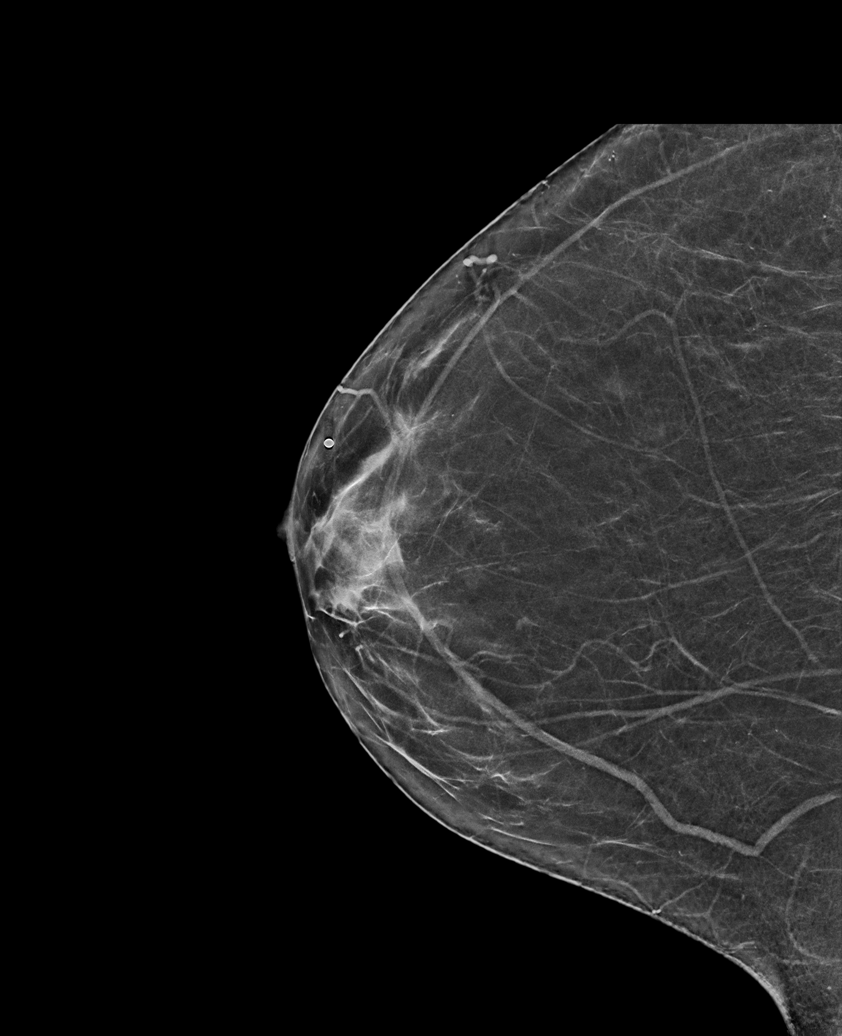

[L MLO synth-2D]
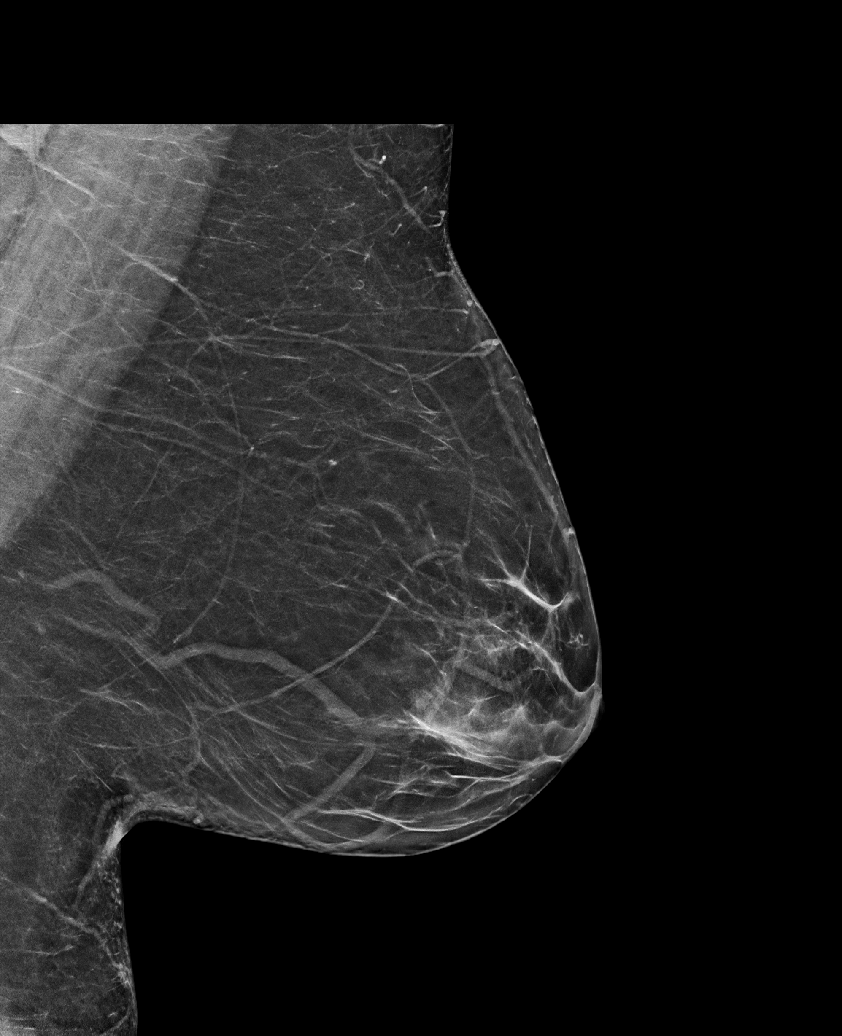

[L MLO tomo · tomo slice 37/74.0]
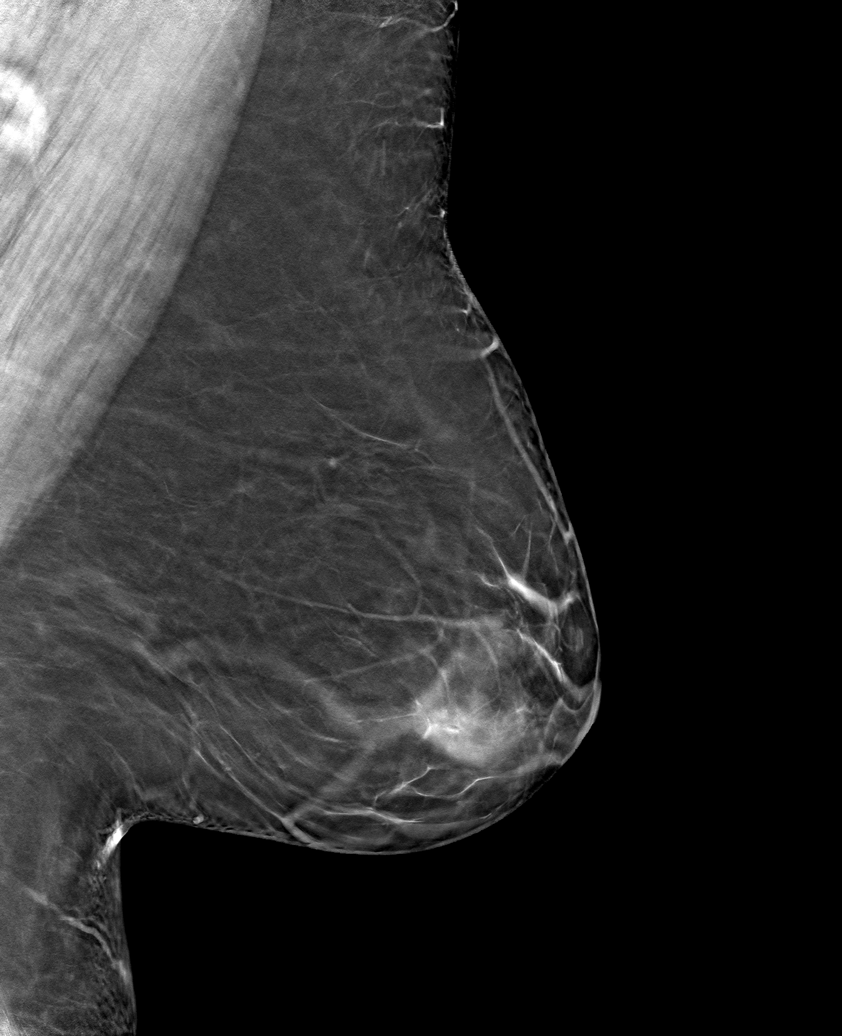

[6 of 30 positions shown; findings below may reference images not displayed]

ACR Breast Density Category b: There are scattered areas of
fibroglandular density.
FINDINGS: Mammogram:

Right breast: A skin BB marks the palpable site of concern in the
upper-outer quadrant of the right breast. A spot tangential view of
this area is a performed in addition to standard views. There is no
new abnormality at the palpable site or elsewhere in the right
breast.

Left breast: No suspicious mass, distortion, or microcalcifications
are identified to suggest presence of malignancy.

Mammographic images were processed with CAD.

On physical exam, I do not palpate a mass or other abnormality at
the site of concern in the upper-outer quadrant of the right breast.

Ultrasound:

Targeted ultrasound is performed throughout the upper-outer quadrant
of the right breast demonstrating no cystic or solid mass.
IMPRESSION: No mammographic or sonographic evidence of malignancy or other
abnormality at the palpable site of concern in the upper outer right
breast. No mammographic evidence of malignancy in the left breast.

RECOMMENDATION:
1. Recommend any further workup of the palpable site in the right
breast be on a clinical basis.

2.  Begin routine annual screening mammography at age 40.

I have discussed the findings and recommendations with the patient.
If applicable, a reminder letter will be sent to the patient
regarding the next appointment.

BI-RADS CATEGORY  1: Negative.
